# Patient Record
Sex: Female | Born: 1970 | Race: White | Hispanic: No | Marital: Married | Smoking: Former smoker
Health system: Southern US, Community
[De-identification: ages and names within clinical notes are randomized; demographics above are authoritative.]

## PROBLEM LIST (undated history)

## (undated) DIAGNOSIS — C801 Malignant (primary) neoplasm, unspecified: Secondary | ICD-10-CM

## (undated) DIAGNOSIS — C189 Malignant neoplasm of colon, unspecified: Secondary | ICD-10-CM

## (undated) HISTORY — PX: COLON RESECTION: SHX5231

## (undated) HISTORY — PX: TUBAL LIGATION: SHX77

## (undated) HISTORY — PX: VARICOSE VEIN SURGERY: SHX832

## (undated) HISTORY — PX: TONSILLECTOMY: SUR1361

## (undated) HISTORY — PX: CHOLECYSTECTOMY: SHX55

---

## 2011-10-10 ENCOUNTER — Emergency Department (HOSPITAL_COMMUNITY): Payer: Self-pay

## 2011-10-10 ENCOUNTER — Emergency Department (HOSPITAL_COMMUNITY)
Admission: EM | Admit: 2011-10-10 | Discharge: 2011-10-10 | Disposition: A | Discharge status: Home / Self Care | Payer: Self-pay | Attending: Emergency Medicine | Admitting: Emergency Medicine

## 2011-10-10 ENCOUNTER — Encounter (HOSPITAL_COMMUNITY): Payer: Self-pay | Admitting: *Deleted

## 2011-10-10 DIAGNOSIS — S9031XA Contusion of right foot, initial encounter: Secondary | ICD-10-CM

## 2011-10-10 DIAGNOSIS — M79609 Pain in unspecified limb: Secondary | ICD-10-CM | POA: Insufficient documentation

## 2011-10-10 DIAGNOSIS — W108XXA Fall (on) (from) other stairs and steps, initial encounter: Secondary | ICD-10-CM | POA: Insufficient documentation

## 2011-10-10 DIAGNOSIS — F17200 Nicotine dependence, unspecified, uncomplicated: Secondary | ICD-10-CM | POA: Insufficient documentation

## 2011-10-10 DIAGNOSIS — S9030XA Contusion of unspecified foot, initial encounter: Secondary | ICD-10-CM | POA: Insufficient documentation

## 2011-10-10 NOTE — ED Provider Notes (Signed)
Medical screening examination/treatment/procedure(s) were performed by non-physician practitioner and as supervising physician I was immediately available for consultation/collaboration.   Dare Spillman, MD 10/10/11 2041 

## 2011-10-10 NOTE — Discharge Instructions (Signed)
Crutch Use You have been prescribed crutches to take weight off one of your lower legs or feet (extremities). When using crutches, make sure you are not putting pressure on the armpit (axilla). This could cause damage to the nerves that extend from your axilla to the hand and arm. When fitted properly the crutches should be 2 to 3 finger widths below the axilla. Your weight should be supported by your hand, and not by resting upon the crutch with the axilla. When walking, first step with the crutches, then swing the healthy leg through and slightly ahead. When going up stairs, first step up with the healthy leg and then follow with the crutches and injured leg up to the same step, and so forth. If there is a handrail, hold both crutches in one hand, place your other hand on the handrail, and while placing your weight on your arms, lift your good leg to the step, then bring the crutches and the injured leg up to that step. Repeat for each step. When going down stairs, first step with the injured leg and crutches, following down with the healthy leg to the same step. Be very careful, as going down stairs with crutches is very challenging. If you feel wobbly or nervous, sit down and inch yourself down the stairs on your butt. To get up from a chair, hold injured leg forward, grab armrest with one hand and the top of the crutches with the other hand. Using these supports, pull yourself up to a standing position. Reverse this procedure for sitting. See your caregiver for follow up as suggested. If you are discharged in an ace wrap and develop numbness, tingling, swelling, or increased pain, loosen the ace wrap and re-wrap looser. If these problems persist, see your caregiver as needed. If you have been instructed to use partial weight bearing, bear (apply) the amount of weight as suggested by your caregiver. Do not bear weight in an amount that causes pain on the area of injury. Document Released: 04/26/2000  Document Revised: 04/18/2011 Document Reviewed: 07/04/2008 [facility] Patient Information 2012 [place], [place].Cryotherapy Cryotherapy means treatment with cold. Ice or gel packs can be used to reduce both pain and swelling. Ice is the most helpful within the first 24 to 48 hours after an injury or flareup from overusing a muscle or joint. Sprains, strains, spasms, burning pain, shooting pain, and aches can all be eased with ice. Ice can also be used when recovering from surgery. Ice is effective, has very few side effects, and is safe for most people to use. PRECAUTIONS  Ice is not a safe treatment option for people with:  Raynaud's phenomenon. This is a condition affecting small blood vessels in the extremities. Exposure to cold may cause your problems to return.   Cold hypersensitivity. There are many forms of cold hypersensitivity, including:   Cold urticaria. Red, itchy hives appear on the skin when the tissues begin to warm after being iced.   Cold erythema. This is a red, itchy rash caused by exposure to cold.   Cold hemoglobinuria. Red blood cells break down when the tissues begin to warm after being iced. The hemoglobin that carry oxygen are passed into the urine because they cannot combine with blood proteins fast enough.   Numbness or altered sensitivity in the area being iced.  If you have any of the following conditions, do not use ice until you have discussed cryotherapy with your caregiver:  Heart conditions, such as arrhythmia, angina, or chronic  heart disease.   High blood pressure.   Healing wounds or open skin in the area being iced.   Current infections.   Rheumatoid arthritis.   Poor circulation.   Diabetes.  Ice slows the blood flow in the region it is applied. This is beneficial when trying to stop inflamed tissues from spreading irritating chemicals to surrounding tissues. However, if you expose your skin to cold temperatures for too long or without the proper  protection, you can damage your skin or nerves. Watch for signs of skin damage due to cold. HOME CARE INSTRUCTIONS Follow these tips to use ice and cold packs safely.  Place a dry or damp towel between the ice and skin. A damp towel will cool the skin more quickly, so you may need to shorten the time that the ice is used.   For a more rapid response, add gentle compression to the ice.   Ice for no more than 10 to 20 minutes at a time. The bonier the area you are icing, the less time it will take to get the benefits of ice.   Check your skin after 5 minutes to make sure there are no signs of a poor response to cold or skin damage.   Rest 20 minutes or more in between uses.   Once your skin is numb, you can end your treatment. You can test numbness by very lightly touching your skin. The touch should be so light that you do not see the skin dimple from the pressure of your fingertip. When using ice, most people will feel these normal sensations in this order: cold, burning, aching, and numbness.   Do not use ice on someone who cannot communicate their responses to pain, such as small children or people with dementia.  HOW TO MAKE AN ICE PACK Ice packs are the most common way to use ice therapy. Other methods include ice massage, ice baths, and cryo-sprays. Muscle creams that cause a cold, tingly feeling do not offer the same benefits that ice offers and should not be used as a substitute unless recommended by your caregiver. To make an ice pack, do one of the following:  Place crushed ice or a bag of frozen vegetables in a sealable plastic bag. Squeeze out the excess air. Place this bag inside another plastic bag. Slide the bag into a pillowcase or place a damp towel between your skin and the bag.   Mix 3 parts water with 1 part rubbing alcohol. Freeze the mixture in a sealable plastic bag. When you remove the mixture from the freezer, it will be slushy. Squeeze out the excess air. Place this  bag inside another plastic bag. Slide the bag into a pillowcase or place a damp towel between your skin and the bag.  SEEK MEDICAL CARE IF:  You develop white spots on your skin. This may give the skin a blotchy (mottled) appearance.   Your skin turns blue or pale.   Your skin becomes waxy or hard.   Your swelling gets worse.  MAKE SURE YOU:   Understand these instructions.   Will watch your condition.   Will get help right away if you are not doing well or get worse.  Document Released: 12/24/2010 Document Revised: 04/18/2011 Document Reviewed: 12/24/2010 [facility] Patient Information 2012 [place], [place].Contusion      A contusion is a deep bruise. Contusions are the result of an injury that caused bleeding under the skin. The contusion may turn blue, purple, or  yellow. Minor injuries will give you a painless contusion, but more severe                                                                                                                                            contusions may stay painful and swollen for a few weeks.  CAUSES  A contusion is usually caused by a blow, trauma, or direct force to an area of the body. SYMPTOMS   Swelling and redness of the injured area.   Bruising of the injured area.   Tenderness and soreness of the injured area.   Pain.  DIAGNOSIS  The diagnosis can be made by taking a history and physical exam. An X-ray, CT scan, or MRI may be needed to determine if there were any associated injuries, such as fractures. TREATMENT  Specific treatment will depend on what area of the body was injured. In general, the best treatment for a contusion is resting, icing, elevating, and applying cold compresses to the injured area. Over-the-counter medicines may also be recommended for pain control. Ask your caregiver what  the best treatment is for your contusion. HOME CARE INSTRUCTIONS   Put ice on the injured area.   Put ice in a plastic bag.   Place a towel between your skin and the bag.   Leave the ice on for 15 to 20 minutes, 3 to 4 times a day.   Only take over-the-counter or prescription medicines for pain, discomfort, or fever as directed by your caregiver. Your caregiver may recommend avoiding anti-inflammatory medicines (aspirin, ibuprofen, and naproxen) for 48 hours because these medicines may increase bruising.   Rest the injured area.   If possible, elevate the injured area to reduce swelling.  SEEK IMMEDIATE MEDICAL CARE IF:   You have increased bruising or swelling.   You have pain that is getting worse.   Your swelling or pain is not relieved with medicines.  MAKE SURE YOU:   Understand these instructions.   Will watch your condition.   Will get help right away if you are not doing well or get worse.  Document Released: 02/06/2005 Document Revised: 04/18/2011 Document Reviewed: 03/04/2011 [facility] Patient Information 2012 [place], [place].   Apply ice 20-30  Min several times daily and elevate foot as much as possible.  Do not attempt bearing   weight until removing the watson Chavarin dressing in 5 days.  After that use the crutches as needed and bear weight as tolerated.  Take ibuprofen 800 mg every 8  hrs with food.  Follow up with dr. Hilda Lias as needed.

## 2011-10-10 NOTE — ED Provider Notes (Signed)
History     CSN: [id number]  Arrival date & time 10/10/11  1543   First MD Initiated Contact with Patient 10/10/11 1617      Chief Complaint  Patient presents with  . Fall    (Consider location/radiation/quality/duration/timing/severity/associated sxs/prior treatment) HPI Comments: Although pt slid on lower back and buttocks she denies any pain.  no head or neck trauma.  No LOC.    Patient is a 41 y.o. female presenting with fall. The history is provided by the patient. No language interpreter was used.  Fall Incident onset: this AM. Incident: while descending stairs.  had one arm full of laundry and the other  hand holding dishes.  slippery shoes caused her to fall  and slide down 13 stairs with the R foot under her body.   Pain location: R foot. The pain is moderate. She was ambulatory at the scene. There was no entrapment after the fall. There was no drug use involved in the accident. There was no alcohol use involved in the accident. Pertinent negatives include no numbness and no loss of consciousness. The symptoms are aggravated by ambulation and pressure on the injury. She has tried NSAIDs for the symptoms. The treatment provided mild relief.    History reviewed. No pertinent past medical history.  Past Surgical History  Procedure Date  . Tubal ligation   . Tonsillectomy   . Varicose vein surgery     History reviewed. No pertinent family history.  History  Substance Use Topics  . Smoking status: Current Everyday Smoker  . Smokeless tobacco: Not on file  . Alcohol Use: Yes     occasionally    OB History    Grav Para Term Preterm Abortions TAB SAB Ect Mult Living                  Review of Systems  Musculoskeletal: Positive for gait problem.       Foot injury  Neurological: Negative for loss of consciousness and numbness.  Psychiatric/Behavioral: Negative for confusion.  All other systems reviewed and are negative.    Allergies  Review of patient's  allergies indicates no known allergies.  Home Medications  No current outpatient prescriptions on file.  BP 135/91  Pulse 93  Temp(Src) 98 F (36.7 C) (Oral)  Resp 16  Ht 5\' 8"  (1.727 m)  Wt 172 lb (78.019 kg)  BMI 26.15 kg/m2  SpO2 100%  LMP 09/22/2011  Physical Exam  Nursing note and vitals reviewed. Constitutional: She is oriented to person, place, and time. She appears well-developed and well-nourished. No distress.  HENT:  Head: Normocephalic and atraumatic.  Eyes: EOM are normal.  Neck: Normal range of motion.  Cardiovascular: Normal rate, regular rhythm and normal heart sounds.   Pulmonary/Chest: Effort normal and breath sounds normal.  Abdominal: Soft. She exhibits no distension. There is no tenderness.  Musculoskeletal: She exhibits tenderness.       Right foot: She exhibits decreased range of motion, tenderness, bony tenderness and swelling. She exhibits normal capillary refill, no crepitus, no deformity and no laceration.       Feet:  Neurological: She is alert and oriented to person, place, and time.  Skin: Skin is warm and dry.  Psychiatric: She has a normal mood and affect. Judgment normal.    ED Course  Procedures (including critical care time)  Labs Reviewed - No data to display Dg Foot Complete Right  10/10/2011  *RADIOLOGY REPORT*  Clinical Data: Right foot pain following a  fall.  RIGHT FOOT COMPLETE - 3+ VIEW  Comparison: None.  Findings: Three views of the right foot demonstrate mild dorsal soft tissue swelling distally.  No fracture or dislocation seen. Calcaneal spurs.  IMPRESSION: No fracture.  Original Report Authenticated By: Darrol Angel, M.D.     1. Foot contusion, right, initial encounter       MDM  No fx identified.  Ice, elevation, watson Richeson dressing x 5 days then weight bearing as tolerated. Ibuprofen 800 mg TID with food.  F/u with dr. Hilda Lias prn.        Worthy Rancher, PA 10/10/11 1729

## 2011-10-10 NOTE — ED Notes (Signed)
Pt c/o falling down 13 steps this am. Pt c/o pain in her right foot. States that she landed on her back and her right foot curled under. Pt denies back pain.

## 2011-12-02 ENCOUNTER — Emergency Department (HOSPITAL_COMMUNITY)
Admission: EM | Admit: 2011-12-02 | Discharge: 2011-12-02 | Disposition: A | Discharge status: Home / Self Care | Payer: Self-pay | Attending: Emergency Medicine | Admitting: Emergency Medicine

## 2011-12-02 ENCOUNTER — Encounter (HOSPITAL_COMMUNITY): Payer: Self-pay | Admitting: Emergency Medicine

## 2011-12-02 DIAGNOSIS — M25519 Pain in unspecified shoulder: Secondary | ICD-10-CM | POA: Insufficient documentation

## 2011-12-02 DIAGNOSIS — W57XXXA Bitten or stung by nonvenomous insect and other nonvenomous arthropods, initial encounter: Secondary | ICD-10-CM | POA: Insufficient documentation

## 2011-12-02 DIAGNOSIS — F17200 Nicotine dependence, unspecified, uncomplicated: Secondary | ICD-10-CM | POA: Insufficient documentation

## 2011-12-02 DIAGNOSIS — S30860A Insect bite (nonvenomous) of lower back and pelvis, initial encounter: Secondary | ICD-10-CM | POA: Insufficient documentation

## 2011-12-02 MED ORDER — DOXYCYCLINE HYCLATE 100 MG PO TABS
100.0000 mg | ORAL_TABLET | Freq: Once | ORAL | Status: AC
  Administered 2011-12-02: 100 mg via ORAL
  Filled 2011-12-02: qty 1

## 2011-12-02 MED ORDER — DOXYCYCLINE HYCLATE 100 MG PO CAPS: 100.0000 mg | ORAL_CAPSULE | Freq: Two times a day (BID) | ORAL | Status: AC

## 2011-12-02 NOTE — ED Provider Notes (Signed)
Medical screening examination/treatment/procedure(s) were performed by non-physician practitioner and as supervising physician I was immediately available for consultation/collaboration.  Donnetta Hutching, MD 12/02/11 [phone]

## 2011-12-02 NOTE — ED Notes (Signed)
Pt c/o left shoulder pain x 1 week and "just not feeling well" since pulling off tick x 9 days ago.

## 2011-12-02 NOTE — ED Provider Notes (Signed)
History     CSN: [id number]  Arrival date & time 12/02/11  0901   First MD Initiated Contact with Patient 12/02/11 0920      Chief Complaint  Patient presents with  . Tick Removal    (Consider location/radiation/quality/duration/timing/severity/associated sxs/prior treatment) HPI Comments: Pt has removed 2 imbedded ticks from her in past 9 days.  C/o L shoulder soreness.  Spoke with family member who told her to come to the ED.  The history is provided by the patient. No language interpreter was used.    History reviewed. No pertinent past medical history.  Past Surgical History  Procedure Date  . Tubal ligation   . Tonsillectomy   . Varicose vein surgery     No family history on file.  History  Substance Use Topics  . Smoking status: Current Everyday Smoker  . Smokeless tobacco: Not on file  . Alcohol Use: Yes     occasionally    OB History    Grav Para Term Preterm Abortions TAB SAB Ect Mult Living                  Review of Systems  Constitutional: Negative for fever and chills.  Musculoskeletal: Positive for arthralgias.  Skin: Negative for rash.  All other systems reviewed and are negative.    Allergies  Review of patient's allergies indicates no known allergies.  Home Medications   Current Outpatient Rx  Name Route Sig Dispense Refill  . DOXYCYCLINE HYCLATE 100 MG PO CAPS Oral Take 1 capsule (100 mg total) by mouth 2 (two) times daily. 20 capsule 0    BP 141/91  Pulse 96  Temp 98 F (36.7 C)  Resp 18  Ht 5\' 8"  (1.727 m)  Wt 178 lb (80.74 kg)  BMI 27.06 kg/m2  SpO2 100%  LMP 11/25/2011  Physical Exam  Nursing note and vitals reviewed. Constitutional: She is oriented to person, place, and time. She appears well-developed and well-nourished. No distress.  HENT:  Head: Normocephalic and atraumatic.  Eyes: EOM are normal.  Neck: Normal range of motion.  Cardiovascular: Normal rate, regular rhythm and normal heart sounds.     Pulmonary/Chest: Effort normal and breath sounds normal.  Abdominal: Soft. She exhibits no distension. There is no tenderness.  Musculoskeletal: Normal range of motion.       Left shoulder: She exhibits normal range of motion, no tenderness, no bony tenderness and no swelling.       Arms:      Soreness with L shoulder movement.  Neurological: She is alert and oriented to person, place, and time.  Skin: Skin is warm and dry.  Psychiatric: She has a normal mood and affect. Judgment normal.    ED Course  Procedures (including critical care time)  Labs Reviewed - No data to display No results found.   1. Tick bite       MDM  rx-doxycycline, 20         Evalina Field, [place] 12/02/11 1021  Evalina Field, [place] 12/02/11 1023

## 2013-03-30 ENCOUNTER — Other Ambulatory Visit (HOSPITAL_COMMUNITY): Payer: Self-pay | Admitting: Physician Assistant

## 2013-03-30 DIAGNOSIS — Z139 Encounter for screening, unspecified: Secondary | ICD-10-CM

## 2013-04-15 ENCOUNTER — Ambulatory Visit (HOSPITAL_COMMUNITY): Payer: Self-pay

## 2013-05-10 ENCOUNTER — Ambulatory Visit (HOSPITAL_COMMUNITY)
Admission: RE | Admit: 2013-05-10 | Discharge: 2013-05-10 | Disposition: A | Discharge status: Home / Self Care | Payer: Self-pay | Source: Ambulatory Visit | Attending: Physician Assistant | Admitting: Physician Assistant

## 2013-05-10 DIAGNOSIS — Z139 Encounter for screening, unspecified: Secondary | ICD-10-CM

## 2018-10-18 ENCOUNTER — Emergency Department (HOSPITAL_COMMUNITY): Payer: BC Managed Care – PPO

## 2018-10-18 ENCOUNTER — Emergency Department (HOSPITAL_COMMUNITY)
Admission: EM | Admit: 2018-10-18 | Discharge: 2018-10-18 | Disposition: A | Discharge status: Home / Self Care | Payer: BC Managed Care – PPO | Attending: Emergency Medicine | Admitting: Emergency Medicine

## 2018-10-18 ENCOUNTER — Encounter (HOSPITAL_COMMUNITY): Payer: Self-pay | Admitting: *Deleted

## 2018-10-18 ENCOUNTER — Other Ambulatory Visit: Payer: Self-pay

## 2018-10-18 DIAGNOSIS — M25571 Pain in right ankle and joints of right foot: Secondary | ICD-10-CM | POA: Insufficient documentation

## 2018-10-18 DIAGNOSIS — Z87891 Personal history of nicotine dependence: Secondary | ICD-10-CM | POA: Insufficient documentation

## 2018-10-18 NOTE — ED Notes (Signed)
Pt reports she has been on prednisone for her L foot/ankle pain   Today stood up and R ankle felt like "soemthing snapped" with severe pain Here for eval   Pt has taken only prednisone for pain

## 2018-10-18 NOTE — Discharge Instructions (Addendum)
Continue taking ibuprofen for pain and swelling.  Add Tylenol on to help with pain. Use the brace to help support your ankle. Make sure you are wearing shoes with good support, this means thick soles with a good arch support. Try and rest your ankle when able. Call your primary care doctor to see if you can schedule a sooner appointment. Return to the emergency room with any new, worsening, concerning symptoms.

## 2018-10-18 NOTE — ED Notes (Signed)
From Rad 

## 2018-10-18 NOTE — ED Notes (Signed)
To Rad 

## 2018-10-18 NOTE — ED Triage Notes (Signed)
Pt c/o right ankle pain that started today when she tried to stand up. Pt reports her ankle gave away and felt like it was "going to snap". Denies injury.

## 2018-10-18 NOTE — ED Provider Notes (Signed)
[facility] EMERGENCY DEPARTMENT Provider Note   CSN: [id number] Arrival date & time: 10/18/18  1446    History   Chief Complaint Chief Complaint  Patient presents with   Ankle Pain    HPI Amy BeltsRobin Gorter is a 48 y.o. female presenting for evaluation of right ankle pain.  Patient states she has been having gradually worsening right ankle pain.  She states it feels like her ankle is going to give out on her.  It worsened significantly last night.  Patient states she has been having pain and swelling of her left ankle, and as such has been favoring her right side.  Patient reports pain starts at bilateral malleoli and radiates up.  No pain at rest, pain with weightbearing and certain positions.  She denies numbness or tingling.  She denies fall, trauma, or injury.  She has been taking 600 mg ibuprofen q4-5 hrs without improvement of sxs.  She denies pain in her calves.  She has no other medical problems, takes no medications daily.  She did recently finish a course of prednisone for her left foot without improvement.  She is not on any antibiotics currently.     HPI  History reviewed. No pertinent past medical history.  There are no active problems to display for this patient.   Past Surgical History:  Procedure Laterality Date   TONSILLECTOMY     TUBAL LIGATION     VARICOSE VEIN SURGERY       OB History   No obstetric history on file.      Home Medications    Prior to Admission medications   Not on File    Family History No family history on file.  Social History Social History   Tobacco Use   Smoking status: Former Smoker   Smokeless tobacco: Never Used  Substance Use Topics   Alcohol use: Yes    Comment: occasionally   Drug use: No     Allergies   Patient has no known allergies.   Review of Systems Review of Systems  Musculoskeletal: Positive for arthralgias and myalgias.  Hematological: Does not bruise/bleed easily.     Physical  Exam Updated Vital Signs BP 134/83 (BP Location: Right Arm)    Pulse 84    Temp 98.5 F (36.9 C) (Oral)    Resp 20    Ht 5\' 8"  (1.727 m)    Wt 78 kg    SpO2 100%    BMI 26.15 kg/m   Physical Exam Vitals signs and nursing note reviewed.  Constitutional:      General: She is not in acute distress.    Appearance: She is well-developed.  HENT:     Head: Normocephalic and atraumatic.  Neck:     Musculoskeletal: Normal range of motion.  Pulmonary:     Effort: Pulmonary effort is normal.  Abdominal:     General: There is no distension.  Musculoskeletal:        General: Tenderness present. No swelling or deformity.     Comments: Tenderness palpation at rest.  Increased pain with hyper dorsiflexion of the ankle.  Pain with inversion and eversion of the ankle.  No pain with plantarflexion.  Pedal pulses intact bilaterally.  Good cap refill distally.  No tenderness palpation of the calf.  No significant swelling or erythema.  Achilles tendon palpable intact without tenderness.  Skin:    General: Skin is warm.     Capillary Refill: Capillary refill takes less than 2 seconds.  Findings: No rash.  Neurological:     Mental Status: She is alert and oriented to person, place, and time.      ED Treatments / Results  Labs (all labs ordered are listed, but only abnormal results are displayed) Labs Reviewed - No data to display  EKG None  Radiology Dg Ankle Complete Right  Result Date: 10/18/2018 CLINICAL DATA:  Severe right ankle pain after feeling like something snapped when she stood up today. EXAM: RIGHT ANKLE - COMPLETE 3+ VIEW COMPARISON:  Right foot x-rays dated Oct 10, 2011. FINDINGS: No acute fracture or dislocation. The ankle mortise is symmetric. The talar dome is intact. No tibiotalar joint effusion. Joint spaces are preserved. Bone mineralization is normal. Small Achilles and plantar enthesophytes. Soft tissues are unremarkable. IMPRESSION: Negative. Electronically Signed   By:  Titus Dubin M.D.   On: 10/18/2018 15:39    Procedures Procedures (including critical care time)  Medications Ordered in ED Medications - No data to display   Initial Impression / Assessment and Plan / ED Course  I have reviewed the triage vital signs and the nursing notes.  Pertinent labs & imaging results that were available during my care of the patient were reviewed by me and considered in my medical decision making (see chart for details).        Pt presenting for evaluation of right ankle pain.  Physical exam reassuring, she is neurovascularly intact.  X-rays viewed and interpreted by me, no fracture or dislocation.  Pain is been worsening after patient has been favoring her left ankle, this putting more strain on her right side.  Pain is worse with certain position and weightbearing, as such likely muscular/tendon/ligament.  Discussed symptomatic treatment with support such as an ASO, importance of supporting the sole of her foot, continue treatment with Tylenol, ibuprofen, and rest, ice, and elevation.  Encourage follow-up with her primary care doctor.  At this time, patient appears safe for discharge.  Return precautions given.  Patient states she understands and agrees to plan.   Final Clinical Impressions(s) / ED Diagnoses   Final diagnoses:  Acute right ankle pain    ED Discharge Orders    None       Franchot Heidelberg, PA-C 10/18/18 1712    Milton Ferguson, MD 10/18/18 2009

## 2019-02-23 ENCOUNTER — Other Ambulatory Visit: Payer: Self-pay

## 2019-02-23 DIAGNOSIS — Z20822 Contact with and (suspected) exposure to covid-19: Secondary | ICD-10-CM

## 2019-02-25 LAB — NOVEL CORONAVIRUS, NAA: SARS-CoV-2, NAA: NOT DETECTED

## 2019-02-26 ENCOUNTER — Telehealth: Payer: Self-pay | Admitting: Family Medicine

## 2019-02-26 NOTE — Telephone Encounter (Signed)
Negative COVID results given. Patient results "NOT Detected." Caller expressed understanding. ° °

## 2019-08-15 ENCOUNTER — Other Ambulatory Visit: Payer: Self-pay

## 2019-08-15 ENCOUNTER — Ambulatory Visit
Admission: EM | Admit: 2019-08-15 | Discharge: 2019-08-15 | Disposition: A | Discharge status: Home / Self Care | Payer: BC Managed Care – PPO | Attending: Emergency Medicine | Admitting: Emergency Medicine

## 2019-08-15 DIAGNOSIS — W57XXXA Bitten or stung by nonvenomous insect and other nonvenomous arthropods, initial encounter: Secondary | ICD-10-CM

## 2019-08-15 DIAGNOSIS — S40261A Insect bite (nonvenomous) of right shoulder, initial encounter: Secondary | ICD-10-CM

## 2019-08-15 MED ORDER — DOXYCYCLINE HYCLATE 100 MG PO TABS
200.0000 mg | ORAL_TABLET | Freq: Once | ORAL | Status: AC
  Administered 2019-08-15: 10:00:00 200 mg via ORAL

## 2019-08-15 NOTE — ED Triage Notes (Signed)
Pt states that she removed tick on right shoulder on Friday , states that right arm is now stiff

## 2019-08-15 NOTE — ED Provider Notes (Signed)
[place]   [id number] 08/15/19 Arrival Time: 0913  CC: Tick bite  SUBJECTIVE:  Amy Hardy is a 49 y.o. female complains of tick attached RT shoulder 2-3 days ago.  States she was outside doing yard work prior to tick bite.  Localizes the tick bite to RT shoulder.  Removed tick at home.  States tick was not engorged, but is unsure how long it was attached.  Reports previous tick bite in the past.  Reports associated RT elbow discomfort and mild nausea.  Denies fever, chills, vomiting, headache, dizziness, weakness, fatigue, rash, or abdominal pain.    ROS: As per HPI.  All other pertinent ROS negative.     History reviewed. No pertinent past medical history. Past Surgical History:  Procedure Laterality Date  . TONSILLECTOMY    . TUBAL LIGATION    . VARICOSE VEIN SURGERY     No Known Allergies No current facility-administered medications on file prior to encounter.   No current outpatient medications on file prior to encounter.   Social History   Socioeconomic History  . Marital status: Married    Spouse name: Not on file  . Number of children: Not on file  . Years of education: Not on file  . Highest education level: Not on file  Occupational History  . Not on file  Tobacco Use  . Smoking status: Former Research scientist (life sciences)  . Smokeless tobacco: Never Used  Substance and Sexual Activity  . Alcohol use: Yes    Comment: occasionally  . Drug use: No  . Sexual activity: Not on file  Other Topics Concern  . Not on file  Social History Narrative  . Not on file   Social Determinants of Health   Financial Resource Strain:   . Difficulty of Paying Living Expenses:   Food Insecurity:   . Worried About Charity fundraiser in the Last Year:   . Arboriculturist in the Last Year:   Transportation Needs:   . Film/video editor (Medical):   Marland Kitchen Lack of Transportation (Non-Medical):   Physical Activity:   . Days of Exercise per Week:   . Minutes of Exercise per Session:     Stress:   . Feeling of Stress :   Social Connections:   . Frequency of Communication with Friends and Family:   . Frequency of Social Gatherings with Friends and Family:   . Attends Religious Services:   . Active Member of Clubs or Organizations:   . Attends Archivist Meetings:   Marland Kitchen Marital Status:   Intimate Partner Violence:   . Fear of Current or Ex-Partner:   . Emotionally Abused:   Marland Kitchen Physically Abused:   . Sexually Abused:    History reviewed. No pertinent family history.  OBJECTIVE: Vitals:   08/15/19 0920  BP: (!) 145/82  Pulse: 95  Resp: 17  Temp: 97.9 F (36.6 C)  TempSrc: Oral  SpO2: 98%    General appearance: alert; no distress Head: NCAT Lungs: normal respiratory effort CV:  Radial pulse 2+ bilaterally Extremities: no edema Skin: warm and dry; small tattoo present; small punctate lesion to RT lateral deltoid with mild surrounding erythema with overlying scab formation, no active bleeding or discharge Psychological: alert and cooperative; normal mood and affect  ASSESSMENT & PLAN:  1. Tick bite of right shoulder, initial encounter     Meds ordered this encounter  Medications  . doxycycline (VIBRA-TABS) tablet 200 mg   Offered longer course of doxycycline.  Declines at this time.  Will follow up with PCP as needed for titers  Single prophylactic dose of doxycycline 200 mg given in office To prevent tick bites, wear long sleeves, long pants, and light colors. Use insect repellent. Follow the instructions on the bottle. If the tick is biting, do not try to remove it with heat, alcohol, petroleum jelly, or fingernail polish. Use tweezers, curved forceps, or a tick-removal tool to grasp the tick. Gently pull up until the tick lets go. Do not twist or jerk the tick. Do not squeeze or crush the tick. Return here or go to ER if you have any new or worsening symptoms (rash, nausea, vomiting, fever, chills, headache, fatigue)   Reviewed expectations  re: course of current medical issues. Questions answered. Outlined signs and symptoms indicating need for more acute intervention. Patient verbalized understanding. After Visit Summary given.   Alvino Chapel [place], PA-C 08/15/19 [phone]

## 2019-08-15 NOTE — Discharge Instructions (Signed)
Single prophylactic dose of doxycycline 200 mg given in office To prevent tick bites, wear long sleeves, long pants, and light colors. Use insect repellent. Follow the instructions on the bottle. If the tick is biting, do not try to remove it with heat, alcohol, petroleum jelly, or fingernail polish. Use tweezers, curved forceps, or a tick-removal tool to grasp the tick. Gently pull up until the tick lets go. Do not twist or jerk the tick. Do not squeeze or crush the tick. Return here or go to ER if you have any new or worsening symptoms (rash, nausea, vomiting, fever, chills, headache, fatigue)

## 2021-03-18 ENCOUNTER — Encounter (HOSPITAL_COMMUNITY): Payer: Self-pay | Admitting: *Deleted

## 2021-03-18 ENCOUNTER — Other Ambulatory Visit: Payer: Self-pay

## 2021-03-18 ENCOUNTER — Emergency Department (HOSPITAL_COMMUNITY): Payer: BC Managed Care – PPO

## 2021-03-18 ENCOUNTER — Emergency Department (HOSPITAL_COMMUNITY)
Admission: EM | Admit: 2021-03-18 | Discharge: 2021-03-18 | Disposition: A | Discharge status: Home / Self Care | Payer: BC Managed Care – PPO | Attending: Emergency Medicine | Admitting: Emergency Medicine

## 2021-03-18 DIAGNOSIS — R42 Dizziness and giddiness: Secondary | ICD-10-CM | POA: Diagnosis present

## 2021-03-18 DIAGNOSIS — F17210 Nicotine dependence, cigarettes, uncomplicated: Secondary | ICD-10-CM | POA: Insufficient documentation

## 2021-03-18 DIAGNOSIS — H81399 Other peripheral vertigo, unspecified ear: Secondary | ICD-10-CM | POA: Diagnosis not present

## 2021-03-18 LAB — CBC
HCT: 41.1 % (ref 36.0–46.0)
Hemoglobin: 13.5 g/dL (ref 12.0–15.0)
MCH: 30.8 pg (ref 26.0–34.0)
MCHC: 32.8 g/dL (ref 30.0–36.0)
MCV: 93.6 fL (ref 80.0–100.0)
Platelets: 224 10*3/uL (ref 150–400)
RBC: 4.39 MIL/uL (ref 3.87–5.11)
RDW: 12.5 % (ref 11.5–15.5)
WBC: 4.7 10*3/uL (ref 4.0–10.5)
nRBC: 0 % (ref 0.0–0.2)

## 2021-03-18 LAB — URINALYSIS, ROUTINE W REFLEX MICROSCOPIC
Bacteria, UA: NONE SEEN
Bilirubin Urine: NEGATIVE
Glucose, UA: NEGATIVE mg/dL
Ketones, ur: NEGATIVE mg/dL
Leukocytes,Ua: NEGATIVE
Nitrite: NEGATIVE
Protein, ur: NEGATIVE mg/dL
Specific Gravity, Urine: 1.004 — ABNORMAL LOW (ref 1.005–1.030)
pH: 8 (ref 5.0–8.0)

## 2021-03-18 LAB — CBG MONITORING, ED: Glucose-Capillary: 76 mg/dL (ref 70–99)

## 2021-03-18 LAB — BASIC METABOLIC PANEL
Anion gap: 6 (ref 5–15)
BUN: 8 mg/dL (ref 6–20)
CO2: 27 mmol/L (ref 22–32)
Calcium: 9.2 mg/dL (ref 8.9–10.3)
Chloride: 107 mmol/L (ref 98–111)
Creatinine, Ser: 0.69 mg/dL (ref 0.44–1.00)
GFR, Estimated: >60 mL/min (ref >60)
Glucose, Bld: 91 mg/dL (ref 70–99)
Potassium: 3.9 mmol/L (ref 3.5–5.1)
Sodium: 140 mmol/L (ref 135–145)

## 2021-03-18 MED ORDER — MECLIZINE HCL 12.5 MG PO TABS
25.0000 mg | ORAL_TABLET | Freq: Once | ORAL | Status: AC
  Administered 2021-03-18: 25 mg via ORAL
  Filled 2021-03-18: qty 2

## 2021-03-18 MED ORDER — MECLIZINE HCL 25 MG PO TABS: 25.0000 mg | ORAL_TABLET | Freq: Three times a day (TID) | ORAL | 0 refills | Status: DC | PRN

## 2021-03-18 NOTE — ED Notes (Signed)
Pt ambulated to the bathroom with a strong steady gait

## 2021-03-18 NOTE — Discharge Instructions (Addendum)
You have been evaluated for dizziness.  Please take meclizine as needed for dizziness.  You may follow-up closely with an ear nose and throat specialist if your symptoms persist.

## 2021-03-18 NOTE — ED Provider Notes (Signed)
[facility] EMERGENCY DEPARTMENT Provider Note   CSN: [id number] Arrival date & time: 03/18/21  0830     History Chief Complaint  Patient presents with   Dizziness    Amy Hardy is a 50 y.o. female.  The history is provided by the patient and medical records. No language interpreter was used.  Dizziness  50 year old female presenting complaining of dizziness.  Patient report for the past month she has had left ear discomfort.  She described more as a pressure sensation in her left ear with associated pain.  She initially was seen by her doctor for her ear discomfort and was diagnosed with an ear infection and was started on antibiotic and steroid.  Despite taking the medication she did not report any improvement.  She follow-up approximately 2 weeks later, at that time it was noted that she did not have any signs of infection and was given Flonase.  She tried medication without relief.  She was seen in the third several days later but was not diagnosed with ear infection.  She continues to endorse pressure in her left ear and yesterday she developed an acute onset of dizziness.  She described dizziness as "a drunken sensation" and felt very unsteady, follows with bouts of nausea, copious amount of vomiting with mild headache.  No thunderclap headache.  Symptom did subside but this morning when she got up, symptoms resumed prompting this ER visit.  She did report having COVID during the past month.  States that has since resolved.  She denies fever, diplopia, tinnitus, sneezing coughing, runny nose or neck stiffness.  No injury.  Rates her symptoms as moderate in severity.  History reviewed. No pertinent past medical history.  There are no problems to display for this patient.   Past Surgical History:  Procedure Laterality Date   CHOLECYSTECTOMY     TONSILLECTOMY     TUBAL LIGATION     VARICOSE VEIN SURGERY       OB History   No obstetric history on file.     No family history  on file.  Social History   Tobacco Use   Smoking status: Some Days    Types: Cigarettes   Smokeless tobacco: Never   Tobacco comments:    1-2 cigarettes "once in a blue moon" as of 03/18/21       Vaping Use   Vaping Use: Never used  Substance Use Topics   Alcohol use: Not Currently    Comment: occasionally   Drug use: No    Home Medications Prior to Admission medications   Not on File    Allergies    Patient has no known allergies.  Review of Systems   Review of Systems  Neurological:  Positive for dizziness.  All other systems reviewed and are negative.  Physical Exam Updated Vital Signs BP (!) 155/89 (BP Location: Left Arm)   Pulse 92   Temp 98.2 F (36.8 C) (Oral)   Resp 14   Ht 5\' 8"  (1.727 m)   Wt 67.6 kg   SpO2 100%   BMI 22.66 kg/m   Physical Exam Vitals and nursing note reviewed.  Constitutional:      General: She is not in acute distress.    Appearance: She is well-developed.  HENT:     Head: Atraumatic.     Right Ear: Tympanic membrane, ear canal and external ear normal. There is no impacted cerumen.     Left Ear: Tympanic membrane, ear canal and external ear  normal. There is no impacted cerumen.     Nose: Nose normal.     Mouth/Throat:     Mouth: Mucous membranes are moist.  Eyes:     Extraocular Movements: Extraocular movements intact.     Conjunctiva/sclera: Conjunctivae normal.     Pupils: Pupils are equal, round, and reactive to light.     Comments: Fatigable horizontal nystagmus noted  Cardiovascular:     Rate and Rhythm: Normal rate and regular rhythm.     Pulses: Normal pulses.  Pulmonary:     Effort: Pulmonary effort is normal.  Abdominal:     Palpations: Abdomen is soft.     Tenderness: There is no abdominal tenderness.  Musculoskeletal:     Cervical back: Normal range of motion and neck supple.  Skin:    Findings: No rash.  Neurological:     Mental Status: She is alert and oriented to person, place, and time.      Comments: Neurologic exam:  Speech clear, pupils equal round reactive to light, extraocular movements intact  Normal peripheral visual fields Cranial nerves III through XII normal including no facial droop Follows commands, moves all extremities x4, normal strength to bilateral upper and lower extremities at all major muscle groups including grip Sensation normal to light touch  Coordination intact, no limb ataxia, finger-nose-finger normal Rapid alternating movements normal No pronator drift Gait normal   Psychiatric:        Mood and Affect: Mood normal.    ED Results / Procedures / Treatments   Labs (all labs ordered are listed, but only abnormal results are displayed) Labs Reviewed  URINALYSIS, ROUTINE W REFLEX MICROSCOPIC - Abnormal; Notable for the following components:      Result Value   Color, Urine STRAW (*)    Specific Gravity, Urine 1.004 (*)    Hgb urine dipstick SMALL (*)    All other components within normal limits  BASIC METABOLIC PANEL  CBC  CBG MONITORING, ED    EKG None  Date: 03/18/2021  Rate: 79  Rhythm: normal sinus rhythm  QRS Axis: normal  Intervals: normal  ST/T Wave abnormalities: normal  Conduction Disutrbances: none  Narrative Interpretation:   Old EKG Reviewed: No significant changes noted    Radiology CT HEAD WO CONTRAST  Result Date: 03/18/2021 CLINICAL DATA:  Dizziness, headache EXAM: CT HEAD WITHOUT CONTRAST TECHNIQUE: Contiguous axial images were obtained from the base of the skull through the vertex without intravenous contrast. COMPARISON:  None. FINDINGS: Brain: No evidence of acute infarction, hemorrhage, hydrocephalus, extra-axial collection or mass lesion/mass effect. Vascular: Negative for hyperdense vessel Skull: Negative Sinuses/Orbits: Negative Other: None IMPRESSION: Negative CT head Electronically Signed   By: Marlan Palau M.D.   On: 03/18/2021 10:56    Procedures Procedures   Medications Ordered in ED Medications   meclizine (ANTIVERT) tablet 25 mg (25 mg Oral Given 03/18/21 1024)    ED Course  I have reviewed the triage vital signs and the nursing notes.  Pertinent labs & imaging results that were available during my care of the patient were reviewed by me and considered in my medical decision making (see chart for details).    MDM Rules/Calculators/A&P                           BP 126/82   Pulse 77   Temp 98.2 F (36.8 C) (Oral)   Resp (!) 9   Ht 5\' 8"  (1.727 m)  Wt 67.6 kg   SpO2 99%   BMI 22.66 kg/m   Final Clinical Impression(s) / ED Diagnoses Final diagnoses:  Peripheral vertigo, unspecified laterality    Rx / DC Orders ED Discharge Orders          Ordered    meclizine (ANTIVERT) 25 MG tablet  3 times daily PRN        03/18/21 1223           10:05 AM Patient presents with acute onset of dizziness after having left ear discomfort for the past month and has been seen evaluate multiple times for it.  Ear exam unremarkable no evidence of ear infection.  No focal neurodeficit on exam, normal gait.  She does endorse some mild headache but no thunderclap headache suggestive of subarachnoid hemorrhage.  Will obtain head CT scan.  I did reach out to neuroradiologist, Dr. Margo Aye for recommendations of imaging, and he felt head CT scan should be able to assess for potential middle ear infection however things like labyrinthitis or schwannoma will be best evaluated through brain MRI with and without contrast as it is the gold standard.  I felt this can be done outpatient if her CT scan unremarkable.  Care discussed with Dr. Estell Harpin.   12:22 PM Labs are reassuring, head CT scan unremarkable, after receiving meclizine patient felt better.  I do encourage patient to follow-up with an ear nose and throat specialist if her symptoms persist she may benefit from an MRI if indicated.  Otherwise patient discharged home with meclizine.  EKG and her labs are reassuring.  Suspect peripheral vertigo,  doubt central cause.   Fayrene Helper, PA-C 03/18/21 1224    Bethann Berkshire, MD 03/20/21 1705

## 2021-03-18 NOTE — ED Triage Notes (Signed)
Pt c/o sudden onset of dizziness that started last night. Pt reports she feels like she is "going to fall over". Pt has had left ear pain for awhile now and initially was told it was an infection and given antibiotics. Pt's ear continued to hurt but has seen the doctor twice and told nothing was wrong. Pt says she still feels pressure in the left ear and wonders if this is related to the dizziness.

## 2022-01-19 ENCOUNTER — Ambulatory Visit
Admission: EM | Admit: 2022-01-19 | Discharge: 2022-01-19 | Disposition: A | Discharge status: Home / Self Care | Payer: BC Managed Care – PPO | Attending: Family Medicine | Admitting: Family Medicine

## 2022-01-19 DIAGNOSIS — R42 Dizziness and giddiness: Secondary | ICD-10-CM | POA: Diagnosis not present

## 2022-01-19 DIAGNOSIS — H9202 Otalgia, left ear: Secondary | ICD-10-CM

## 2022-01-19 MED ORDER — PREDNISONE 10 MG (48) PO TBPK: ORAL_TABLET | ORAL | 0 refills | Status: AC

## 2022-01-19 MED ORDER — ONDANSETRON 4 MG PO TBDP: 4.0000 mg | ORAL_TABLET | Freq: Three times a day (TID) | ORAL | 0 refills | Status: AC | PRN

## 2022-01-19 NOTE — ED Triage Notes (Signed)
Pt reports left ear pain, ear fullness and felt a pop sound since last night. Pt reports she is being having trouble with her ears since she had COVID in October 2022 and she is doing follow ups with ENT and PCP.

## 2022-01-21 NOTE — ED Provider Notes (Signed)
  [facility]   [id number] 01/19/22 Arrival Time: 4098  ASSESSMENT & PLAN:  1. Otalgia of left ear   2. Vertigo    Meds ordered this encounter  Medications   predniSONE (STERAPRED UNI-PAK 48 TAB) 10 MG (48) TBPK tablet    Sig: Take as directed.    Dispense:  48 tablet    Refill:  0   ondansetron (ZOFRAN-ODT) 4 MG disintegrating tablet    Sig: Take 1 tablet (4 mg total) by mouth every 8 (eight) hours as needed for nausea or vomiting.    Dispense:  30 tablet    Refill:  0   Cough medication sedation precautions. Discussed typical duration of symptoms. OTC symptom care as needed. Ensure adequate fluid intake and rest. May f/u with PCP or here as needed.  Reviewed expectations re: course of current medical issues. Questions answered. Outlined signs and symptoms indicating need for more acute intervention. Patient verbalized understanding. After Visit Summary given.   SUBJECTIVE: History from: patient.  Amy Hardy is a 51 y.o. female who reports left ear pain, ear fullness and felt a pop sound since last night. Pt reports she is being having trouble with her ears since she had COVID in October 2022 and she is doing follow ups with ENT and PCP. Reports steroids have helped in the past. Otherwise well. No recent illnesses. Afebrile. Vertigo with quick head movements. No change from previous episodes.   Social History   Tobacco Use  Smoking Status Some Days   Types: Cigarettes  Smokeless Tobacco Never  Tobacco Comments   1-2 cigarettes "once in a blue moon" as of 03/18/21        OBJECTIVE:  Vitals:   01/19/22 1007  BP: 127/82  Pulse: 86  Resp: 16  Temp: 98.1 F (36.7 C)  TempSrc: Oral  SpO2: 99%    General appearance: alert; appears fatigued Ear Canal: normal bilat TM: bilateral: appear normal; slight clear fluid behind TMs Neck: supple without LAD Lungs: unlabored respirations, symmetrical air entry; cough: absent; no respiratory distress Skin: warm  and dry Psychological: alert and cooperative; normal mood and affect  No Known Allergies  History reviewed. No pertinent past medical history. History reviewed. No pertinent family history. Social History   Socioeconomic History   Marital status: Married    Spouse name: Not on file   Number of children: Not on file   Years of education: Not on file   Highest education level: Not on file  Occupational History   Not on file  Tobacco Use   Smoking status: Some Days    Types: Cigarettes   Smokeless tobacco: Never   Tobacco comments:    1-2 cigarettes "once in a blue moon" as of 03/18/21       Vaping Use   Vaping Use: Never used  Substance and Sexual Activity   Alcohol use: Not Currently    Comment: occasionally   Drug use: No   Sexual activity: Yes  Other Topics Concern   Not on file  Social History Narrative   Not on file   Social Determinants of Health   Financial Resource Strain: Not on file  Food Insecurity: Not on file  Transportation Needs: Not on file  Physical Activity: Not on file  Stress: Not on file  Social Connections: Not on file  Intimate Partner Violence: Not on file             Mardella Layman, MD 01/21/22 1250

## 2022-03-26 ENCOUNTER — Ambulatory Visit (HOSPITAL_COMMUNITY): Payer: BC Managed Care – PPO | Attending: Otolaryngology | Admitting: Physical Therapy

## 2022-03-26 DIAGNOSIS — R42 Dizziness and giddiness: Secondary | ICD-10-CM | POA: Diagnosis present

## 2022-03-26 NOTE — Therapy (Signed)
OUTPATIENT PHYSICAL THERAPY VESTIBULAR EVALUATION     Patient Name: Amy Hardy MRN: [id number] DOB:10-05-1970, 51 y.o., female Today's Date: 03/26/2022   PT End of Session - 03/26/22 1632     Visit Number 1    Number of Visits 8    Date for PT Re-Evaluation 05/21/22    Authorization Type bcbs    Progress Note Due on Visit 8    PT Start Time 1600    PT Stop Time 1630    PT Time Calculation (min) 30 min    Activity Tolerance Patient tolerated treatment well    Behavior During Therapy [facility] for tasks assessed/performed             No past medical history on file. Past Surgical History:  Procedure Laterality Date   CHOLECYSTECTOMY     TONSILLECTOMY     TUBAL LIGATION     VARICOSE VEIN SURGERY     There are no problems to display for this patient.   PCP: Marcene Brawn REFERRING PROVIDER: Leta Baptist  REFERRING DIAG:  Diagnosis Description  PT eval/tx for recurrent dizziness, left ear pain, left ear pressure    THERAPY DIAG:  Dizziness  ONSET DATE: 2020  Rationale for Evaluation and Treatment: Rehabilitation  SUBJECTIVE:   SUBJECTIVE STATEMENT: PT states that she becomes dizzy many times throughout the day.  Her left ear feels full she went to see Dr. Benjamine Mola and he stated that she has central vertigo.  She gets so dizzy that she feels like she is going to vomit.  When she looks at people it looks like they are oscillating, when she does the dishes and they clink the noise bothers her .  She has been trying to push through it but it is getting harder and harder.   Pt accompanied by: self  PERTINENT HISTORY: Amy Hardy is a 51 y.o. female who reports left ear pain, ear fullness and felt a pop sound with occasional dizziness with quick head turns. Pt reports she is being having trouble with her ears since she had COVID in October 2022   PAIN:  Are you having pain? No  PRECAUTIONS: None  WEIGHT BEARING RESTRICTIONS: No  FALLS: Has patient fallen in last 6  months? No  LIVING ENVIRONMENT: Lives with: lives with their family Lives in: House/apartment Stairs: Yes: Internal: 13 steps; on right going up pt states that she has a death grip on the rail. Has following equipment at home: None  PLOF: Independent  PATIENT GOALS: less dizziness   OBJECTIVE:   DIAGNOSTIC FINDINGS: CT of brainElectronically Signed   By: Franchot Gallo M.D.   On: 03/18/2021 10:56 Overall cognitive status: Within functional limits for tasks assessed    COGNITION:unremarkable       POSTURE:  No Significant postural limitations  Cervical ROM:  wfl  VESTIBULAR ASSESSMENT:  GENERAL OBSERVATION: unremarkable    SYMPTOM BEHAVIOR:  Subjective history: see above  Non-Vestibular symptoms:  fullness in ears  , things look like they are vibrating   Type of dizziness: Spinning/Vertigo and "World moves"  Frequency: off and on all day long but she does not get dizzy driving.   Duration: a few minutes   Aggravating factors:  mornings   Relieving factors: rest  Progression of symptoms: unchanged  OCULOMOTOR EXAM:  Ocular Alignment: normal  Ocular ROM: No Limitations  Spontaneous Nystagmus: absent  Gaze-Induced Nystagmus: absent things become blurry   Smooth Pursuits: intact but makes pt feel nauseated.   Saccades: intact  VESTIBULAR - OCULAR REFLEX:   Slow VOR: Normal  VOR Cancellation: Comment: become nauseated     POSITIONAL TESTING: Right Dix-Hallpike: no nystagmus Left Dix-Hallpike: no nystagmus Right Roll Test: no nystagmus Left Roll Test: no nystagmus  MOTION SENSITIVITY:  Motion Sensitivity Quotient Intensity: 0 = none, 1 = Lightheaded, 2 = Mild, 3 = Moderate, 4 = Severe, 5 = Vomiting  Intensity  1. Sitting to supine 1  2. Supine to L side   3. Supine to R side   4. Supine to sitting 3  5. L Hallpike-Dix 0  6. Up from L  0  7. R Hallpike-Dix 0  8. Up from R  0  9. Sitting, head tipped to L knee   10. Head up from L knee   11. Sitting,  head tipped to R knee   12. Head up from R knee   13. Sitting head turns x5 2  14.Sitting head nods x5 2  15. In stance, 180 turn to L    16. In stance, 180 turn to R     OTHOSTATICS: not done  FUNCTIONAL GAIT:  Rt single leg stance:   15"  ; Lt single leg stance: 15"     VESTIBULAR TREATMENT:                                                                                                   DATE: 03/26/2022 Habituation:  Seated Vertical Head Movements: symptom description: nauseated after 5, Seated Horizontal Head Movements: number of reps: 5 with increased nausea, and Compensatory Saccades: number of reps: 5 with increased nausea.    PATIENT EDUCATION: Education details: HEP Person educated: Patient Education method: Explanation Education comprehension: returned demonstration  Mignon Code: [email or web address] URL: [email or web address] Date: 03/26/2022 Prepared by: Rayetta Humphrey  Exercises - Seated Gaze Stabilization with Head Rotation  - 4-6 x daily - 7 x weekly - 1 sets - 3-10 reps - Seated Horizontal Smooth Pursuit  - 4-6 x daily - 7 x weekly - 1 sets - 3-10 reps - Seated Vertical Smooth Pursuit  - 4-6 x daily - 7 x weekly - 1 sets - 3-10 reps - Seated Gaze Stabilization with Head Nod  - 4-6 x daily - 7 x weekly - 1 sets - 3-10 reps  GOALS: Goals reviewed with patient? No  SHORT TERM GOALS: Target date: 04/16/2022   PT to be I in HEP in order to decrease her sx by 50% Baseline: Goal status: INITIAL   LONG TERM GOALS: Target date: 05/07/2022    Pt sx to be resolved 75%  Baseline:  Goal status: INITIAL  2.  PT to be able to single leg stance B for 30 seconds to reduce risk of falling.  Baseline:  Goal status: INITIAL 3.  Pt to state that she is able to go up and down her steps with light rail assist in confidence.             Goal status:  Initial    ASSESSMENT:  CLINICAL IMPRESSION: Patient is a 51 y.o. female who was  seen  today for physical therapy evaluation and treatment for dizziness. Cannalith testing for anterior, posterior and horizontal canals were (-).  Saccades and eye gaze exercises made pt feel nauseated.  Pt will benefit from habituation exercises for eye and body motion and balance to decrease her sx.   OBJECTIVE IMPAIRMENTS: dizziness.   ACTIVITY LIMITATIONS: stairs and locomotion level  PARTICIPATION LIMITATIONS: community activity  PERSONAL FACTORS: Time since onset of injury/illness/exacerbation are also affecting patient's functional outcome.   REHAB POTENTIAL: Good  CLINICAL DECISION MAKING: Stable/uncomplicated  EVALUATION COMPLEXITY: Moderate   PLAN:  PT FREQUENCY: 2x/week  PT DURATION: 6 weeks  PLANNED INTERVENTIONS: Therapeutic exercises, Therapeutic activity, Neuromuscular re-education, Balance training, Patient/Family education, Self Care, Canalith repositioning, and Manual therapy  PLAN FOR NEXT SESSION: Begin nose to knee and sit to supine/supine to sit motion for habituation.  Progress smooth pursuits and eye gaze to standing if able.  Then progress to walking and standing 180 and 360 body turns.    Virgina Organ, PT CLT [phone]  03/26/2022, 1645 PM

## 2022-04-10 ENCOUNTER — Ambulatory Visit (HOSPITAL_COMMUNITY): Payer: BC Managed Care – PPO | Admitting: Physical Therapy

## 2022-04-10 DIAGNOSIS — R42 Dizziness and giddiness: Secondary | ICD-10-CM | POA: Diagnosis not present

## 2022-04-10 NOTE — Therapy (Signed)
OUTPATIENT PHYSICAL THERAPY VESTIBULAR TREATMENT     Patient Name: Baylie Drakes MRN: [id number] DOB:11-10-1970, 51 y.o., female Today's Date: 04/10/2022   PT End of Session - 04/10/22 1110     Visit Number 2    Number of Visits 8    Date for PT Re-Evaluation 05/21/22    Authorization Type bcbs    Progress Note Due on Visit 8    PT Start Time 1110    PT Stop Time 1150    PT Time Calculation (min) 40 min    Activity Tolerance Patient tolerated treatment well    Behavior During Therapy [facility] for tasks assessed/performed             No past medical history on file. Past Surgical History:  Procedure Laterality Date   CHOLECYSTECTOMY     TONSILLECTOMY     TUBAL LIGATION     VARICOSE VEIN SURGERY     There are no problems to display for this patient.   PCP: Liliane Shi REFERRING PROVIDER: Newman Pies  REFERRING DIAG:  Diagnosis Description  PT eval/tx for recurrent dizziness, left ear pain, left ear pressure    THERAPY DIAG:  Dizziness  ONSET DATE: 2020  Rationale for Evaluation and Treatment: Rehabilitation  SUBJECTIVE:   SUBJECTIVE STATEMENT: Has been doing HEP. Feels they are getting easier. Fullness in ear feels not as bad, but still has vertigo that comes and goes.   Eval: PT states that she becomes dizzy many times throughout the day.  Her left ear feels full she went to see Dr. Suszanne Conners and he stated that she has central vertigo.  She gets so dizzy that she feels like she is going to vomit.  When she looks at people it looks like they are oscillating, when she does the dishes and they clink the noise bothers her .  She has been trying to push through it but it is getting harder and harder.   Pt accompanied by: self  PERTINENT HISTORY: Jalexis Breed is a 51 y.o. female who reports left ear pain, ear fullness and felt a pop sound with occasional dizziness with quick head turns. Pt reports she is being having trouble with her ears since she had COVID in October  2022   PAIN:  Are you having pain? No  PRECAUTIONS: None  WEIGHT BEARING RESTRICTIONS: No  FALLS: Has patient fallen in last 6 months? No  LIVING ENVIRONMENT: Lives with: lives with their family Lives in: House/apartment Stairs: Yes: Internal: 13 steps; on right going up pt states that she has a death grip on the rail. Has following equipment at home: None  PLOF: Independent  PATIENT GOALS: less dizziness   OBJECTIVE:   DIAGNOSTIC FINDINGS: CT of brainElectronically Signed   By: Marlan Palau M.D.   On: 03/18/2021 10:56 Overall cognitive status: Within functional limits for tasks assessed    COGNITION:unremarkable       POSTURE:  No Significant postural limitations  Cervical ROM:  wfl  VESTIBULAR ASSESSMENT:  GENERAL OBSERVATION: unremarkable    SYMPTOM BEHAVIOR:  Subjective history: see above  Non-Vestibular symptoms:  fullness in ears  , things look like they are vibrating   Type of dizziness: Spinning/Vertigo and "World moves"  Frequency: off and on all day long but she does not get dizzy driving.   Duration: a few minutes   Aggravating factors:  mornings   Relieving factors: rest  Progression of symptoms: unchanged  OCULOMOTOR EXAM:  Ocular Alignment: normal  Ocular ROM:  No Limitations  Spontaneous Nystagmus: absent  Gaze-Induced Nystagmus: absent things become blurry   Smooth Pursuits: intact but makes pt feel nauseated.   Saccades: intact  VESTIBULAR - OCULAR REFLEX:   Slow VOR: Normal  VOR Cancellation: Comment: become nauseated     POSITIONAL TESTING: Right Dix-Hallpike: no nystagmus Left Dix-Hallpike: no nystagmus Right Roll Test: no nystagmus Left Roll Test: no nystagmus  MOTION SENSITIVITY:  Motion Sensitivity Quotient Intensity: 0 = none, 1 = Lightheaded, 2 = Mild, 3 = Moderate, 4 = Severe, 5 = Vomiting  Intensity  1. Sitting to supine 1  2. Supine to L side   3. Supine to R side   4. Supine to sitting 3  5. L Hallpike-Dix 0   6. Up from L  0  7. R Hallpike-Dix 0  8. Up from R  0  9. Sitting, head tipped to L knee   10. Head up from L knee   11. Sitting, head tipped to R knee   12. Head up from R knee   13. Sitting head turns x5 2  14.Sitting head nods x5 2  15. In stance, 180 turn to L    16. In stance, 180 turn to R     OTHOSTATICS: not done  FUNCTIONAL GAIT:  Rt single leg stance:   15"  ; Lt single leg stance: 15"     VESTIBULAR TREATMENT:                                                                                                   DATE:    04/10/22 Seated head turn x20 no dizziness Seated head nod x20 no dizziness  Seated VOR x 1 horizontal and vertical 2 x 10 each (slight dizziness with horizontal) Smooth pursuit horizontal x20 (mod dizziness/ spinning)  Smooth pursuit vertical x 20 (no dizziness)  Saccades horizontal and vertical 2 x 10 each  (Increased dizziness vertical)   Nose to knee RT and LT 5 x each (no symptoms)  03/26/2022 Habituation:  Seated Vertical Head Movements: symptom description: nauseated after 5, Seated Horizontal Head Movements: number of reps: 5 with increased nausea, and Compensatory Saccades: number of reps: 5 with increased nausea.    PATIENT EDUCATION: Education details: HEP Person educated: Patient Education method: Explanation Education comprehension: returned demonstration  HOME EXERCISE PROGRAM:Access Code: [email or web address] URL: [email or web address] 04/10/22 - Seated Horizontal Saccades  - 4-6 x daily - 7 x weekly - 2-3 sets - 10 reps - Seated Vertical Saccades  - 4-6 x daily - 7 x weekly - 2-3 sets - 10 reps  Date: 03/26/2022 Prepared by: Virgina Organ  Exercises - Seated Gaze Stabilization with Head Rotation  - 4-6 x daily - 7 x weekly - 1 sets - 3-10 reps - Seated Horizontal Smooth Pursuit  - 4-6 x daily - 7 x weekly - 1 sets - 3-10 reps - Seated Vertical Smooth Pursuit  - 4-6 x daily - 7 x weekly - 1 sets - 3-10 reps - Seated  Gaze Stabilization with Head Nod  - 4-6 x daily - 7 x  weekly - 1 sets - 3-10 reps  GOALS: Goals reviewed with patient? No  SHORT TERM GOALS: Target date: 05/01/2022   PT to be I in HEP in order to decrease her sx by 50% Baseline: Goal status: INITIAL   LONG TERM GOALS: Target date: 05/22/2022    Pt sx to be resolved 75%  Baseline:  Goal status: INITIAL  2.  PT to be able to single leg stance B for 30 seconds to reduce risk of falling.  Baseline:  Goal status: INITIAL  3.  Pt to state that she is able to go up and down her steps with light rail assist in confidence.             Goal status:  Initial    ASSESSMENT:  CLINICAL IMPRESSION: Reviewed goals and HEP. Progressed seated habituation exercise. Patient tolerated well overall. Challenged with current level of activity describing array of symptoms. Symptoms generally worse with horizontal movement verus vertical. Symptoms range from, dizzy, blurred vision, nausea and feeling like eyes are "skipping" with saccades, though not observed. Updated HEP and issued handout. Patient will continue to benefit from skilled therapy services to reduce remaining deficits and improve functional ability.    OBJECTIVE IMPAIRMENTS: dizziness.   ACTIVITY LIMITATIONS: stairs and locomotion level  PARTICIPATION LIMITATIONS: community activity  PERSONAL FACTORS: Time since onset of injury/illness/exacerbation are also affecting patient's functional outcome.   REHAB POTENTIAL: Good  CLINICAL DECISION MAKING: Stable/uncomplicated  EVALUATION COMPLEXITY: Moderate   PLAN:  PT FREQUENCY: 2x/week  PT DURATION: 6 weeks  PLANNED INTERVENTIONS: Therapeutic exercises, Therapeutic activity, Neuromuscular re-education, Balance training, Patient/Family education, Self Care, Canalith repositioning, and Manual therapy  PLAN FOR NEXT SESSION: sit to supine/supine to sit motion for habituation.  Progress smooth pursuits and eye gaze to standing if  able. Then progress to walking and standing 180 and 360 body turns.   11:11 AM, 04/10/22 Georges Lynch PT DPT  Physical Therapist with [facility]  [phone]

## 2022-04-17 ENCOUNTER — Ambulatory Visit (HOSPITAL_COMMUNITY): Payer: BC Managed Care – PPO | Attending: Otolaryngology | Admitting: Physical Therapy

## 2022-04-17 DIAGNOSIS — R42 Dizziness and giddiness: Secondary | ICD-10-CM | POA: Diagnosis present

## 2022-04-17 NOTE — Therapy (Signed)
OUTPATIENT PHYSICAL THERAPY VESTIBULAR TREATMENT     Patient Name: Amy Hardy MRN: [id number] DOB:[birth year], 51 y.o., female Today's Date: 04/17/2022   PT End of Session - 04/17/22 1626     Visit Number 3    Number of Visits 8    Date for PT Re-Evaluation 05/21/22    Authorization Type bcbs    Progress Note Due on Visit 8    PT Start Time 1555    PT Stop Time 1625    PT Time Calculation (min) 30 min    Activity Tolerance Patient tolerated treatment well    Behavior During Therapy [facility] for tasks assessed/performed              No past medical history on file. Past Surgical History:  Procedure Laterality Date   CHOLECYSTECTOMY     TONSILLECTOMY     TUBAL LIGATION     VARICOSE VEIN SURGERY     There are no problems to display for this patient.   PCP: Marcene Brawn REFERRING PROVIDER: Leta Baptist  REFERRING DIAG:  Diagnosis Description  PT eval/tx for recurrent dizziness, left ear pain, left ear pressure    THERAPY DIAG:  Dizziness  ONSET DATE: 2020  Rationale for Evaluation and Treatment: Rehabilitation  SUBJECTIVE:   SUBJECTIVE STATEMENT: Pt states that she is feeling better.  She does not have the fullness that she does.  The exercises are not as nauseating as they having been.  PERTINENT HISTORY: Amy Hardy is a 51 y.o. female who reports left ear pain, ear fullness and felt a pop sound with occasional dizziness with quick head turns. Pt reports she is being having trouble with her ears since she had COVID in October 2022   PAIN:  Are you having pain? No  PRECAUTIONS: None  WEIGHT BEARING RESTRICTIONS: No  FALLS: Has patient fallen in last 6 months? No  LIVING ENVIRONMENT: Lives with: lives with their family Lives in: House/apartment Stairs: Yes: Internal: 13 steps; on right going up pt states that she has a death grip on the rail. Has following equipment at home: None  PLOF: Independent  PATIENT GOALS: less dizziness   OBJECTIVE:    DIAGNOSTIC FINDINGS: CT of brainElectronically Signed   By: Franchot Gallo M.D.   On: 03/18/2021 10:56 Overall cognitive status: Within functional limits for tasks assessed    COGNITION:unremarkable       POSTURE:  No Significant postural limitations  Cervical ROM:  wfl  VESTIBULAR ASSESSMENT:  GENERAL OBSERVATION: unremarkable    SYMPTOM BEHAVIOR:  Subjective history: see above  Non-Vestibular symptoms:  fullness in ears  , things look like they are vibrating   Type of dizziness: Spinning/Vertigo and "World moves"  Frequency: off and on all day long but she does not get dizzy driving.   Duration: a few minutes   Aggravating factors:  mornings   Relieving factors: rest  Progression of symptoms: unchanged  OCULOMOTOR EXAM:  Ocular Alignment: normal  Ocular ROM: No Limitations  Spontaneous Nystagmus: absent  Gaze-Induced Nystagmus: absent things become blurry   Smooth Pursuits: intact but makes pt feel nauseated.   Saccades: intact  VESTIBULAR - OCULAR REFLEX:   Slow VOR: Normal  VOR Cancellation: Comment: become nauseated     POSITIONAL TESTING: Right Dix-Hallpike: no nystagmus Left Dix-Hallpike: no nystagmus Right Roll Test: no nystagmus Left Roll Test: no nystagmus  MOTION SENSITIVITY:  Motion Sensitivity Quotient Intensity: 0 = none, 1 = Lightheaded, 2 = Mild, 3 = Moderate, 4 = Severe,  5 = Vomiting  Intensity 04/17/22  1. Sitting to supine 1 0  2. Supine to L side  2 and anxious   3. Supine to R side  1  4. Supine to sitting 3 0  5. L Hallpike-Dix 0   6. Up from L  0   7. R Hallpike-Dix 0   8. Up from R  0   9. Sitting, head tipped to L knee    10. Head up from L knee    11. Sitting, head tipped to R knee    12. Head up from R knee    13. Sitting head turns x5 2   14.Sitting head nods x5 2   15. In stance, 180 turn to L     16. In stance, 180 turn to R      OTHOSTATICS: not done  FUNCTIONAL GAIT:  Rt single leg stance:   15"  ; Lt single leg  stance: 15"     VESTIBULAR TREATMENT:                                                                                                   DATE:  04/17/22 Tested position changes:  long sit to supine no sx; supine to long sit no sx.   Supine to Lt side lying moderate sx began habituation . Supine to Rt slight sx  began habituation Sit to stand no sx  Stand:   Head turns right:  fine; head turns in front of a busy background moderate sx.  Gave as habituation Head turn left slight sx; in front of busy background moderate sx; gave as habituation. Single leg stance 04/10/22 Seated head turn x20 no dizziness Seated head nod x20 no dizziness  Seated VOR x 1 horizontal and vertical 2 x 10 each (slight dizziness with horizontal) Smooth pursuit horizontal x20 (mod dizziness/ spinning)  Smooth pursuit vertical x 20 (no dizziness)  Saccades horizontal and vertical 2 x 10 each  (Increased dizziness vertical)   Nose to knee RT and LT 5 x each (no symptoms)  03/26/2022 Habituation:  Seated Vertical Head Movements: symptom description: nauseated after 5, Seated Horizontal Head Movements: number of reps: 5 with increased nausea, and Compensatory Saccades: number of reps: 5 with increased nausea.    PATIENT EDUCATION: Education details: HEP Person educated: Patient Education method: Explanation Education comprehension: returned demonstration  [place] Code: [email or web address] URL: [email or web address] 04/17/22 - Supine to Long Sitting Vestibular Habituation  - 4-6 x daily - 7 x weekly - 1 sets - 10 reps - Supine to Right Sidelying Vestibular Habituation  - 4-6 x daily - 7 x weekly - 1 sets - 10 reps - Supine to Left Sidelying Vestibular Habituation  - 4-6 x daily - 7 x weekly - 1 sets - 10 reps - Standing with Head Rotation  - 4-6 x daily - 7 x weekly - 1 sets - 10 reps - Standing with Head Nod  - 4-6 x daily - 7 x weekly - 1 sets - 10 reps - Single Leg Stance with  Support  -  3 x daily - 7 x weekly - 1 sets - 3 reps 04/10/22 - Seated Horizontal Saccades  - 4-6 x daily - 7 x weekly - 2-3 sets - 10 reps - Seated Vertical Saccades  - 4-6 x daily - 7 x weekly - 2-3 sets - 10 reps  Date: 03/26/2022 Prepared by: Rayetta Humphrey  Exercises - Seated Gaze Stabilization with Head Rotation  - 4-6 x daily - 7 x weekly - 1 sets - 3-10 reps - Seated Horizontal Smooth Pursuit  - 4-6 x daily - 7 x weekly - 1 sets - 3-10 reps - Seated Vertical Smooth Pursuit  - 4-6 x daily - 7 x weekly - 1 sets - 3-10 reps - Seated Gaze Stabilization with Head Nod  - 4-6 x daily - 7 x weekly - 1 sets - 3-10 reps  GOALS: Goals reviewed with patient? No  SHORT TERM GOALS: Target date: 05/08/2022   PT to be I in HEP in order to decrease her sx by 50% Baseline: Goal status: MET   LONG TERM GOALS: Target date: 05/29/2022    Pt sx to be resolved 75%  Baseline:  Goal status: INITIAL  2.  PT to be able to single leg stance B for 30 seconds to reduce risk of falling.  Baseline:  Goal status: INITIAL  3.  Pt to state that she is able to go up and down her steps with light rail assist in confidence.             Goal status:  Initial    ASSESSMENT:  CLINICAL IMPRESSION: Pt sx continue to improve.  Advanced HEP to standing with busy back ground as well as adding single leg stance.  Updated HEP and issued handout. Patient will continue to benefit from skilled therapy services to reduce remaining deficits and improve functional ability.    OBJECTIVE IMPAIRMENTS: dizziness.   ACTIVITY LIMITATIONS: stairs and locomotion level  PARTICIPATION LIMITATIONS: community activity  PERSONAL FACTORS: Time since onset of injury/illness/exacerbation are also affecting patient's functional outcome.   REHAB POTENTIAL: Good  CLINICAL DECISION MAKING: Stable/uncomplicated  EVALUATION COMPLEXITY: Moderate   PLAN:  PT FREQUENCY: 2x/week  PT DURATION: 6 weeks  PLANNED INTERVENTIONS:  Therapeutic exercises, Therapeutic activity, Neuromuscular re-education, Balance training, Patient/Family education, Self Care, Canalith repositioning, and Manual therapy  PLAN FOR NEXT SESSION: Check 90 and 180 turns for sx.  If (+) give for HEP.   progress to walking head turns, saccades.   Rayetta Humphrey, [place] CLT [phone]  [phone]

## 2022-04-25 ENCOUNTER — Ambulatory Visit (HOSPITAL_COMMUNITY): Payer: BC Managed Care – PPO | Admitting: Physical Therapy

## 2022-04-25 DIAGNOSIS — R42 Dizziness and giddiness: Secondary | ICD-10-CM

## 2022-04-25 NOTE — Therapy (Signed)
OUTPATIENT PHYSICAL THERAPY VESTIBULAR TREATMENT     Patient Name: Amy Hardy MRN: [id number] DOB:Oct 21, 1970, 51 y.o., female Today's Date: 04/25/2022   PT End of Session - 04/25/22 1718     Visit Number 4    Number of Visits 8    Date for PT Re-Evaluation 05/21/22    Authorization Type bcbs    Progress Note Due on Visit 8    PT Start Time 1452    PT Stop Time 1517    PT Time Calculation (min) 25 min    Activity Tolerance Patient tolerated treatment well    Behavior During Therapy [facility] for tasks assessed/performed              No past medical history on file. Past Surgical History:  Procedure Laterality Date   CHOLECYSTECTOMY     TONSILLECTOMY     TUBAL LIGATION     VARICOSE VEIN SURGERY     There are no problems to display for this patient.   PCP: Marcene Brawn REFERRING PROVIDER: Leta Baptist  REFERRING DIAG:  Diagnosis Description  PT eval/tx for recurrent dizziness, left ear pain, left ear pressure    THERAPY DIAG:  Dizziness  ONSET DATE: 2020  Rationale for Evaluation and Treatment: Rehabilitation  SUBJECTIVE:   SUBJECTIVE STATEMENT: Pt states that she did not sleep well last night and she feels off today but on a whole she is feeling better.  Her Dizziness is 2-3 x /day.  PERTINENT HISTORY: Elanie Hammitt is a 51 y.o. female who reports left ear pain, ear fullness and felt a pop sound with occasional dizziness with quick head turns. Pt reports she is being having trouble with her ears since she had COVID in October 2022   PAIN:  Are you having pain? No  PRECAUTIONS: None  WEIGHT BEARING RESTRICTIONS: No  FALLS: Has patient fallen in last 6 months? No  LIVING ENVIRONMENT: Lives with: lives with their family Lives in: House/apartment Stairs: Yes: Internal: 13 steps; on right going up pt states that she has a death grip on the rail. Has following equipment at home: None  PLOF: Independent  PATIENT GOALS: less dizziness   OBJECTIVE:    DIAGNOSTIC FINDINGS: CT of brainElectronically Signed   By: Franchot Gallo M.D.   On: 03/18/2021 10:56 Overall cognitive status: Within functional limits for tasks assessed    COGNITION:unremarkable       POSTURE:  No Significant postural limitations  Cervical ROM:  wfl  VESTIBULAR ASSESSMENT:  GENERAL OBSERVATION: unremarkable    SYMPTOM BEHAVIOR:  Subjective history: see above  Non-Vestibular symptoms:  fullness in ears  , things look like they are vibrating   Type of dizziness: Spinning/Vertigo and "World moves"  Frequency: off and on all day long but she does not get dizzy driving.   Duration: a few minutes   Aggravating factors:  mornings   Relieving factors: rest  Progression of symptoms: unchanged  OCULOMOTOR EXAM:  Ocular Alignment: normal  Ocular ROM: No Limitations  Spontaneous Nystagmus: absent  Gaze-Induced Nystagmus: absent things become blurry   Smooth Pursuits: intact but makes pt feel nauseated.   Saccades: intact  VESTIBULAR - OCULAR REFLEX:   Slow VOR: Normal  VOR Cancellation: Comment: become nauseated     POSITIONAL TESTING: Right Dix-Hallpike: no nystagmus Left Dix-Hallpike: no nystagmus Right Roll Test: no nystagmus Left Roll Test: no nystagmus  MOTION SENSITIVITY:  Motion Sensitivity Quotient Intensity: 0 = none, 1 = Lightheaded, 2 = Mild, 3 = Moderate, 4 =  Severe, 5 = Vomiting  Intensity 04/17/22  1. Sitting to supine 1 0  2. Supine to L side  2 and anxious   3. Supine to R side  1  4. Supine to sitting 3 0  5. L Hallpike-Dix 0   6. Up from L  0   7. R Hallpike-Dix 0   8. Up from R  0   9. Sitting, head tipped to L knee    10. Head up from L knee    11. Sitting, head tipped to R knee    12. Head up from R knee    13. Sitting head turns x5 2   14.Sitting head nods x5 2   15. In stance, 180 turn to L     16. In stance, 180 turn to R      OTHOSTATICS: not done  FUNCTIONAL GAIT:  Rt single leg stance:   15"  ; Lt single leg  stance: 15"     VESTIBULAR TREATMENT:                                                                                                   DATE:  04/25/22 Left 90 degree turn x 5 with mod to max sx Right 90 degree turn with min to mod sx Walking with gaze horizontal stabilization(eyes turn RT and LT) Walking with gaze vertical stabilization(eyes go up and down) 04/17/22 Tested position changes:  long sit to supine no sx; supine to long sit no sx.   Supine to Lt side lying moderate sx began habituation . Supine to Rt slight sx  began habituation Sit to stand no sx  Stand:   Head turns right:  fine; head turns in front of a busy background moderate sx.  Gave as habituation Head turn left slight sx; in front of busy background moderate sx; gave as habituation. Single leg stance 04/10/22 Seated head turn x20 no dizziness Seated head nod x20 no dizziness  Seated VOR x 1 horizontal and vertical 2 x 10 each (slight dizziness with horizontal) Smooth pursuit horizontal x20 (mod dizziness/ spinning)  Smooth pursuit vertical x 20 (no dizziness)  Saccades horizontal and vertical 2 x 10 each  (Increased dizziness vertical)   Nose to knee RT and LT 5 x each (no symptoms)  03/26/2022 Habituation:  Seated Vertical Head Movements: symptom description: nauseated after 5, Seated Horizontal Head Movements: number of reps: 5 with increased nausea, and Compensatory Saccades: number of reps: 5 with increased nausea.    PATIENT EDUCATION: Education details: HEP Person educated: Patient Education method: Explanation Education comprehension: returned demonstration  [place] Code: [email or web address] URL: [email or web address] 04/25/22 - Standing Quarter Turn with Counter Support  - 4-6 x daily - 7 x weekly - 1 sets - 5-10 reps - Walking Gaze Stabilization Head Rotation  - 4-6 x daily - 7 x weekly - 1 sets - 3 reps - Walking Gaze Stabilization Head Nod  - 4-6 x daily - 7 x weekly  - 1 sets - 3 reps 04/17/22 - Supine to Long Sitting Vestibular Habituation  -  4-6 x daily - 7 x weekly - 1 sets - 10 reps - Supine to Right Sidelying Vestibular Habituation  - 4-6 x daily - 7 x weekly - 1 sets - 10 reps - Supine to Left Sidelying Vestibular Habituation  - 4-6 x daily - 7 x weekly - 1 sets - 10 reps - Standing with Head Rotation  - 4-6 x daily - 7 x weekly - 1 sets - 10 reps - Standing with Head Nod  - 4-6 x daily - 7 x weekly - 1 sets - 10 reps - Single Leg Stance with Support  - 3 x daily - 7 x weekly - 1 sets - 3 reps 04/10/22 - Seated Horizontal Saccades  - 4-6 x daily - 7 x weekly - 2-3 sets - 10 reps - Seated Vertical Saccades  - 4-6 x daily - 7 x weekly - 2-3 sets - 10 reps  Date: 03/26/2022 Prepared by: Rayetta Humphrey  Exercises - Seated Gaze Stabilization with Head Rotation  - 4-6 x daily - 7 x weekly - 1 sets - 3-10 reps - Seated Horizontal Smooth Pursuit  - 4-6 x daily - 7 x weekly - 1 sets - 3-10 reps - Seated Vertical Smooth Pursuit  - 4-6 x daily - 7 x weekly - 1 sets - 3-10 reps - Seated Gaze Stabilization with Head Nod  - 4-6 x daily - 7 x weekly - 1 sets - 3-10 reps  GOALS: Goals reviewed with patient? No  SHORT TERM GOALS: Target date: 05/16/2022   PT to be I in HEP in order to decrease her sx by 50% Baseline: Goal status: MET   LONG TERM GOALS: Target date: 06/06/2022    Pt sx to be resolved 75%  Baseline:  Goal status: INITIAL  2.  PT to be able to single leg stance B for 30 seconds to reduce risk of falling.  Baseline:  Goal status: INITIAL  3.  Pt to state that she is able to go up and down her steps with light rail assist in confidence.             Goal status:  Initial    ASSESSMENT:  CLINICAL IMPRESSION: Pt sx continue to improve.  Advanced HEP to turning and walking activity.  Updated HEP and issued handout. Patient will continue to benefit from skilled therapy services to reduce remaining deficits and improve functional ability.     OBJECTIVE IMPAIRMENTS: dizziness.   ACTIVITY LIMITATIONS: stairs and locomotion level  PARTICIPATION LIMITATIONS: community activity  PERSONAL FACTORS: Time since onset of injury/illness/exacerbation are also affecting patient's functional outcome.   REHAB POTENTIAL: Good  CLINICAL DECISION MAKING: Stable/uncomplicated  EVALUATION COMPLEXITY: Moderate   PLAN:  PT FREQUENCY: 2x/week  PT DURATION: 6 weeks  PLANNED INTERVENTIONS: Therapeutic exercises, Therapeutic activity, Neuromuscular re-education, Balance training, Patient/Family education, Self Care, Canalith repositioning, and Manual therapy  PLAN FOR NEXT SESSION: Check 180 turns for sx.  If (+) give for HEP.   progress to walking head turns, and retro gait activity.   Rayetta Humphrey, [place] [phone]  [phone]

## 2022-04-30 ENCOUNTER — Ambulatory Visit (HOSPITAL_COMMUNITY): Payer: BC Managed Care – PPO | Admitting: Physical Therapy

## 2022-04-30 DIAGNOSIS — R42 Dizziness and giddiness: Secondary | ICD-10-CM

## 2022-04-30 NOTE — Therapy (Signed)
OUTPATIENT PHYSICAL THERAPY VESTIBULAR TREATMENT     Patient Name: Amy Hardy MRN: [id number] DOB:06/08/1970, 51 y.o., female Today's Date: 04/25/2022   PT End of Session - 04/25/22 1718     Visit Number 4    Number of Visits 8    Date for PT Re-Evaluation 05/21/22    Authorization Type bcbs    Progress Note Due on Visit 8    PT Start Time 1452    PT Stop Time 1517    PT Time Calculation (min) 25 min    Activity Tolerance Patient tolerated treatment well    Behavior During Therapy [facility] for tasks assessed/performed              No past medical history on file. Past Surgical History:  Procedure Laterality Date   CHOLECYSTECTOMY     TONSILLECTOMY     TUBAL LIGATION     VARICOSE VEIN SURGERY     There are no problems to display for this patient.   PCP: Marcene Brawn REFERRING PROVIDER: Leta Baptist  REFERRING DIAG:  Diagnosis Description  PT eval/tx for recurrent dizziness, left ear pain, left ear pressure    THERAPY DIAG:  Dizziness  ONSET DATE: 2020  Rationale for Evaluation and Treatment: Rehabilitation  SUBJECTIVE:   SUBJECTIVE STATEMENT: Pt states that she thought that she was progressing well but then she went into [employer] and did some Christmas shopping and she felt the strange feeling come over her.  She states that she almost had a panic attack. This happened on the 17th and it took her all evening to settle down.   PERTINENT HISTORY: Amy Hardy is a 51 y.o. female who reports left ear pain, ear fullness and felt a pop sound with occasional dizziness with quick head turns. Pt reports she is being having trouble with her ears since she had COVID in October 2022   PAIN:  Are you having pain? No  PRECAUTIONS: None  WEIGHT BEARING RESTRICTIONS: No  FALLS: Has patient fallen in last 6 months? No  LIVING ENVIRONMENT: Lives with: lives with their family Lives in: House/apartment Stairs: Yes: Internal: 13 steps; on right going up pt states that she  has a death grip on the rail. Has following equipment at home: None  PLOF: Independent  PATIENT GOALS: less dizziness   OBJECTIVE:   DIAGNOSTIC FINDINGS: CT of brainElectronically Signed   By: Franchot Gallo M.D.   On: 03/18/2021 10:56 Overall cognitive status: Within functional limits for tasks assessed    COGNITION:unremarkable       POSTURE:  No Significant postural limitations  Cervical ROM:  wfl  VESTIBULAR ASSESSMENT:  GENERAL OBSERVATION: unremarkable    SYMPTOM BEHAVIOR:  Subjective history: see above  Non-Vestibular symptoms:  fullness in ears  , things look like they are vibrating   Type of dizziness: Spinning/Vertigo and "World moves"  Frequency: off and on all day long but she does not get dizzy driving.   Duration: a few minutes   Aggravating factors:  mornings   Relieving factors: rest  Progression of symptoms: unchanged  OCULOMOTOR EXAM:  Ocular Alignment: normal  Ocular ROM: No Limitations  Spontaneous Nystagmus: absent  Gaze-Induced Nystagmus: absent things become blurry   Smooth Pursuits: intact but makes pt feel nauseated.   Saccades: intact  VESTIBULAR - OCULAR REFLEX:   Slow VOR: Normal  VOR Cancellation: Comment: become nauseated     POSITIONAL TESTING: Right Dix-Hallpike: no nystagmus Left Dix-Hallpike: no nystagmus Right Roll Test: no nystagmus Left  Roll Test: no nystagmus  MOTION SENSITIVITY:  Motion Sensitivity Quotient Intensity: 0 = none, 1 = Lightheaded, 2 = Mild, 3 = Moderate, 4 = Severe, 5 = Vomiting  Intensity 04/17/22  1. Sitting to supine 1 0  2. Supine to L side  2 and anxious   3. Supine to R side  1  4. Supine to sitting 3 0  5. L Hallpike-Dix 0   6. Up from L  0   7. R Hallpike-Dix 0   8. Up from R  0   9. Sitting, head tipped to L knee    10. Head up from L knee    11. Sitting, head tipped to R knee    12. Head up from R knee    13. Sitting head turns x5 2   14.Sitting head nods x5 2   15. In stance, 180  turn to L     16. In stance, 180 turn to R      OTHOSTATICS: not done  FUNCTIONAL GAIT:  Rt single leg stance:   15"  ; Lt single leg stance: 15"     VESTIBULAR TREATMENT:                                                                                                   DATE:  05/01/23 180 turns x 5 B Retro gait 2 x down hallway Waking down the hall with 1/4 head turns x 1 RT Walking down the hall with 1/4 head nods x 1 Rt  Sit to stand quickly x 10  Vector stance 3" x 3 reps  04/25/22 Left 90 degree turn x 5 with mod to max sx Right 90 degree turn with min to mod sx Walking with gaze horizontal stabilization(eyes turn RT and LT) Walking with gaze vertical stabilization(eyes go up and down) 04/17/22 Tested position changes:  long sit to supine no sx; supine to long sit no sx.   Supine to Lt side lying moderate sx began habituation . Supine to Rt slight sx  began habituation Sit to stand no sx  Stand:   Head turns right:  fine; head turns in front of a busy background moderate sx.  Gave as habituation Head turn left slight sx; in front of busy background moderate sx; gave as habituation. Single leg stance 04/10/22 Seated head turn x20 no dizziness Seated head nod x20 no dizziness  Seated VOR x 1 horizontal and vertical 2 x 10 each (slight dizziness with horizontal) Smooth pursuit horizontal x20 (mod dizziness/ spinning)  Smooth pursuit vertical x 20 (no dizziness)  Saccades horizontal and vertical 2 x 10 each  (Increased dizziness vertical)   Nose to knee RT and LT 5 x each (no symptoms)  03/26/2022 Habituation:  Seated Vertical Head Movements: symptom description: nauseated after 5, Seated Horizontal Head Movements: number of reps: 5 with increased nausea, and Compensatory Saccades: number of reps: 5 with increased nausea.    PATIENT EDUCATION: Education details: HEP Person educated: Patient Education method: Explanation Education comprehension: returned  demonstration  [place] Code: [email or web address] URL:  [email or web address] Access Code: [email or web address] URL: [email or web address] Date: 04/30/2022 Prepared by: Rayetta Humphrey  Exercises -- Backward Walking Gaze Stabilization with Head Rotation (VOR I)  - 4 x daily - 7 x weekly - 1 sets - 1-2 reps - Single Leg Balance with Opposite Leg Star Reach  - 1 x daily - 7 x weekly - 3 sets - 10 reps 04/25/22 - Standing Quarter Turn with Counter Support  - 4-6 x daily - 7 x weekly - 1 sets - 5-10 reps - Walking Gaze Stabilization Head Rotation  - 4-6 x daily - 7 x weekly - 1 sets - 3 reps - Walking Gaze Stabilization Head Nod  - 4-6 x daily - 7 x weekly - 1 sets - 3 reps 04/17/22 - Supine to Long Sitting Vestibular Habituation  - 4-6 x daily - 7 x weekly - 1 sets - 10 reps - Supine to Right Sidelying Vestibular Habituation  - 4-6 x daily - 7 x weekly - 1 sets - 10 reps - Supine to Left Sidelying Vestibular Habituation  - 4-6 x daily - 7 x weekly - 1 sets - 10 reps - Standing with Head Rotation  - 4-6 x daily - 7 x weekly - 1 sets - 10 reps - Standing with Head Nod  - 4-6 x daily - 7 x weekly - 1 sets - 10 reps - Single Leg Stance with Support  - 3 x daily - 7 x weekly - 1 sets - 3 reps 04/10/22 - Seated Horizontal Saccades  - 4-6 x daily - 7 x weekly - 2-3 sets - 10 reps - Seated Vertical Saccades  - 4-6 x daily - 7 x weekly - 2-3 sets - 10 reps  Date: 03/26/2022 Prepared by: Rayetta Humphrey  Exercises - Seated Gaze Stabilization with Head Rotation  - 4-6 x daily - 7 x weekly - 1 sets - 3-10 reps - Seated Horizontal Smooth Pursuit  - 4-6 x daily - 7 x weekly - 1 sets - 3-10 reps - Seated Vertical Smooth Pursuit  - 4-6 x daily - 7 x weekly - 1 sets - 3-10 reps - Seated Gaze Stabilization with Head Nod  - 4-6 x daily - 7 x weekly - 1 sets - 3-10 reps  GOALS: Goals reviewed with patient? No  SHORT TERM GOALS: Target date: 05/16/2022   PT to be I in HEP  in order to decrease her sx by 50% Baseline: Goal status: MET   LONG TERM GOALS: Target date: 06/06/2022    Pt sx to be resolved 75%  Baseline:  Goal status: INITIAL  2.  PT to be able to single leg stance B for 30 seconds to reduce risk of falling.  Baseline:  Goal status: INITIAL  3.  Pt to state that she is able to go up and down her steps with light rail assist in confidence.             Goal status:  Initial    ASSESSMENT:  CLINICAL IMPRESSION: Reassured pt that small set backs are normal.  Therapist then advanced vestibular by adding 180 turns, vector stances as well as retro gait.  Pt continues to progress and is more confident completing her ADL's.  Pt will continue to benefit from skilled PT to return to her prior functioning status.     OBJECTIVE IMPAIRMENTS: dizziness.   ACTIVITY LIMITATIONS: stairs and locomotion level  PARTICIPATION LIMITATIONS: community activity  PERSONAL FACTORS: Time since onset of injury/illness/exacerbation are  also affecting patient's functional outcome.   REHAB POTENTIAL: Good  CLINICAL DECISION MAKING: Stable/uncomplicated  EVALUATION COMPLEXITY: Moderate   PLAN:  PT FREQUENCY: 2x/week  PT DURATION: 6 weeks  PLANNED INTERVENTIONS: Therapeutic exercises, Therapeutic activity, Neuromuscular re-education, Balance training, Patient/Family education, Self Care, Canalith repositioning, and Manual therapy  PLAN FOR NEXT SESSION: Check 180 turns for sx.  If (+) give for HEP.   progress to walking head turns, and retro gait activity.   Rayetta Humphrey, [place] [phone]  [phone]

## 2022-05-09 ENCOUNTER — Encounter (HOSPITAL_COMMUNITY): Payer: BC Managed Care – PPO | Admitting: Physical Therapy

## 2022-05-14 ENCOUNTER — Ambulatory Visit (HOSPITAL_COMMUNITY): Payer: BC Managed Care – PPO | Attending: Otolaryngology | Admitting: Physical Therapy

## 2022-05-14 ENCOUNTER — Encounter (HOSPITAL_COMMUNITY): Payer: Self-pay | Admitting: Physical Therapy

## 2022-05-14 DIAGNOSIS — R42 Dizziness and giddiness: Secondary | ICD-10-CM | POA: Insufficient documentation

## 2022-05-14 NOTE — Therapy (Unsigned)
OUTPATIENT PHYSICAL THERAPY VESTIBULAR TREATMENT     Patient Name: Amy Hardy MRN: [id number] DOB:31-Dec-1970, 52 y.o., female Today's Date: 04/25/2022  END OF SESSION:   PT End of Session - 05/14/22 1649     Visit Number 6    Number of Visits 8    Date for PT Re-Evaluation 05/21/22    Authorization Type bcbs    Progress Note Due on Visit 8    PT Start Time 9735    PT Stop Time 1725    PT Time Calculation (min) 38 min    Activity Tolerance Patient tolerated treatment well    Behavior During Therapy [facility] for tasks assessed/performed              No past medical history on file. Past Surgical History:  Procedure Laterality Date   CHOLECYSTECTOMY     TONSILLECTOMY     TUBAL LIGATION     VARICOSE VEIN SURGERY     There are no problems to display for this patient.   PCP: Marcene Brawn REFERRING PROVIDER: Leta Baptist  REFERRING DIAG:  Diagnosis Description  PT eval/tx for recurrent dizziness, left ear pain, left ear pressure    THERAPY DIAG:  Dizziness  ONSET DATE: 2020  Rationale for Evaluation and Treatment: Rehabilitation  SUBJECTIVE:   SUBJECTIVE STATEMENT: Didn't do exercise last week. Had to have a medical procedure. Still having issues with dizziness. Feels she moves to fast which contributes to symptoms.    PERTINENT HISTORY: Amy Hardy is a 52 y.o. female who reports left ear pain, ear fullness and felt a pop sound with occasional dizziness with quick head turns. Pt reports she is being having trouble with her ears since she had COVID in October 2022   PAIN:  Are you having pain? No  PRECAUTIONS: None  WEIGHT BEARING RESTRICTIONS: No  FALLS: Has patient fallen in last 6 months? No  LIVING ENVIRONMENT: Lives with: lives with their family Lives in: House/apartment Stairs: Yes: Internal: 13 steps; on right going up pt states that she has a death grip on the rail. Has following equipment at home: None  PLOF: Independent  PATIENT GOALS:  less dizziness   OBJECTIVE:   DIAGNOSTIC FINDINGS: CT of brainElectronically Signed   By: Franchot Gallo M.D.   On: 03/18/2021 10:56 Overall cognitive status: Within functional limits for tasks assessed    COGNITION:unremarkable       POSTURE:  No Significant postural limitations  Cervical ROM:  wfl  VESTIBULAR ASSESSMENT:  GENERAL OBSERVATION: unremarkable    SYMPTOM BEHAVIOR:  Subjective history: see above  Non-Vestibular symptoms:  fullness in ears  , things look like they are vibrating   Type of dizziness: Spinning/Vertigo and "World moves"  Frequency: off and on all day long but she does not get dizzy driving.   Duration: a few minutes   Aggravating factors:  mornings   Relieving factors: rest  Progression of symptoms: unchanged  OCULOMOTOR EXAM:  Ocular Alignment: normal  Ocular ROM: No Limitations  Spontaneous Nystagmus: absent  Gaze-Induced Nystagmus: absent things become blurry   Smooth Pursuits: intact but makes pt feel nauseated.   Saccades: intact  VESTIBULAR - OCULAR REFLEX:   Slow VOR: Normal  VOR Cancellation: Comment: become nauseated     POSITIONAL TESTING: Right Dix-Hallpike: no nystagmus Left Dix-Hallpike: no nystagmus Right Roll Test: no nystagmus Left Roll Test: no nystagmus  MOTION SENSITIVITY:  Motion Sensitivity Quotient Intensity: 0 = none, 1 = Lightheaded, 2 = Mild, 3 = Moderate,  4 = Severe, 5 = Vomiting  Intensity 04/17/22  1. Sitting to supine 1 0  2. Supine to L side  2 and anxious   3. Supine to R side  1  4. Supine to sitting 3 0  5. L Hallpike-Dix 0   6. Up from L  0   7. R Hallpike-Dix 0   8. Up from R  0   9. Sitting, head tipped to L knee    10. Head up from L knee    11. Sitting, head tipped to R knee    12. Head up from R knee    13. Sitting head turns x5 2   14.Sitting head nods x5 2   15. In stance, 180 turn to L     16. In stance, 180 turn to R      OTHOSTATICS: not done  FUNCTIONAL GAIT:  Rt single leg  stance:   15"  ; Lt single leg stance: 15"     VESTIBULAR TREATMENT:                                                                                                   DATE:  05/14/22 Seated head turns x 10 (increased)  Seated head nods x 10 (no sx)  Retested head turns x10 (decreased sx) Sit to stand x 10 (no sx)   Gait with head turns 2RT Gait with head nods 2RT Tandem gait 2 RT Tandem stance with head turns x 15 each  SLS vectors 3 x 5" holds HHA x 1  Standing VOR x 1 2  x10  05/01/23 180 turns x 5 B Retro gait 2 x down hallway Waking down the hall with 1/4 head turns x 1 RT Walking down the hall with 1/4 head nods x 1 Rt  Sit to stand quickly x 10  Vector stance 3" x 3 reps   04/25/22 Left 90 degree turn x 5 with mod to max sx Right 90 degree turn with min to mod sx Walking with gaze horizontal stabilization(eyes turn RT and LT) Walking with gaze vertical stabilization(eyes go up and down) 04/17/22 Tested position changes:  long sit to supine no sx; supine to long sit no sx.   Supine to Lt side lying moderate sx began habituation . Supine to Rt slight sx  began habituation Sit to stand no sx  Stand:   Head turns right:  fine; head turns in front of a busy background moderate sx.  Gave as habituation Head turn left slight sx; in front of busy background moderate sx; gave as habituation. Single leg stance 04/10/22 Seated head turn x20 no dizziness Seated head nod x20 no dizziness  Seated VOR x 1 horizontal and vertical 2 x 10 each (slight dizziness with horizontal) Smooth pursuit horizontal x20 (mod dizziness/ spinning)  Smooth pursuit vertical x 20 (no dizziness)  Saccades horizontal and vertical 2 x 10 each  (Increased dizziness vertical)   Nose to knee RT and LT 5 x each (no symptoms)  03/26/2022 Habituation:  Seated Vertical Head Movements: symptom description: nauseated  after 5, Seated Horizontal Head Movements: number of reps: 5 with increased nausea, and  Compensatory Saccades: number of reps: 5 with increased nausea.    PATIENT EDUCATION: Education details: HEP Person educated: Patient Education method: Explanation Education comprehension: returned demonstration  [place] Code: [email or web address] URL: [email or web address] Access Code: [email or web address] URL: [email or web address]  05/14/22 - Tandem Stance with Head Rotation  - 2 x daily - 7 x weekly - 2 sets - 10 reps - Standing Gaze Stabilization with Head Rotation  - 2 x daily - 7 x weekly - 2 sets - 10 reps  Date: 04/30/2022 Prepared by: Rayetta Humphrey  Exercises -- Backward Walking Gaze Stabilization with Head Rotation (VOR I)  - 4 x daily - 7 x weekly - 1 sets - 1-2 reps - Single Leg Balance with Opposite Leg Star Reach  - 1 x daily - 7 x weekly - 3 sets - 10 reps 04/25/22 - Standing Quarter Turn with Counter Support  - 4-6 x daily - 7 x weekly - 1 sets - 5-10 reps - Walking Gaze Stabilization Head Rotation  - 4-6 x daily - 7 x weekly - 1 sets - 3 reps - Walking Gaze Stabilization Head Nod  - 4-6 x daily - 7 x weekly - 1 sets - 3 reps 04/17/22 - Supine to Long Sitting Vestibular Habituation  - 4-6 x daily - 7 x weekly - 1 sets - 10 reps - Supine to Right Sidelying Vestibular Habituation  - 4-6 x daily - 7 x weekly - 1 sets - 10 reps - Supine to Left Sidelying Vestibular Habituation  - 4-6 x daily - 7 x weekly - 1 sets - 10 reps - Standing with Head Rotation  - 4-6 x daily - 7 x weekly - 1 sets - 10 reps - Standing with Head Nod  - 4-6 x daily - 7 x weekly - 1 sets - 10 reps - Single Leg Stance with Support  - 3 x daily - 7 x weekly - 1 sets - 3 reps 04/10/22 - Seated Horizontal Saccades  - 4-6 x daily - 7 x weekly - 2-3 sets - 10 reps - Seated Vertical Saccades  - 4-6 x daily - 7 x weekly - 2-3 sets - 10 reps  Date: 03/26/2022 Prepared by: Rayetta Humphrey  Exercises - Seated Gaze Stabilization with Head Rotation  - 4-6 x daily - 7 x  weekly - 1 sets - 3-10 reps - Seated Horizontal Smooth Pursuit  - 4-6 x daily - 7 x weekly - 1 sets - 3-10 reps - Seated Vertical Smooth Pursuit  - 4-6 x daily - 7 x weekly - 1 sets - 3-10 reps - Seated Gaze Stabilization with Head Nod  - 4-6 x daily - 7 x weekly - 1 sets - 3-10 reps  GOALS: Goals reviewed with patient? No  SHORT TERM GOALS: Target date: 05/16/2022   PT to be I in HEP in order to decrease her sx by 50% Baseline: Goal status: MET   LONG TERM GOALS: Target date: 06/06/2022    Pt sx to be resolved 75%  Baseline:  Goal status: INITIAL  2.  PT to be able to single leg stance B for 30 seconds to reduce risk of falling.  Baseline:  Goal status: INITIAL  3.  Pt to state that she is able to go up and down her steps with light rail assist in confidence.  Goal status:  Initial    ASSESSMENT:  CLINICAL IMPRESSION:   Discussed possibility of anxiety overlay to dizziness presentation per patient subjective. Patient states she does notice some anxiety surrounding her dizzy spells and that this is likely a contributing factor. Continued with habituation progression to patient tolerance. Patient tolerated well overall. Symptoms that did present resolve rather quickly. Patient does well with added balance exercise. Updated HEP to include standing gaze stabilization and tandem balance with head turns and issued handout. Patient will continue to benefit from skilled therapy services to reduce remaining deficits and improve functional ability.     OBJECTIVE IMPAIRMENTS: dizziness.   ACTIVITY LIMITATIONS: stairs and locomotion level  PARTICIPATION LIMITATIONS: community activity  PERSONAL FACTORS: Time since onset of injury/illness/exacerbation are also affecting patient's functional outcome.   REHAB POTENTIAL: Good  CLINICAL DECISION MAKING: Stable/uncomplicated  EVALUATION COMPLEXITY: Moderate   PLAN:  PT FREQUENCY: 2x/week  PT DURATION: 6 weeks  PLANNED  INTERVENTIONS: Therapeutic exercises, Therapeutic activity, Neuromuscular re-education, Balance training, Patient/Family education, Self Care, Canalith repositioning, and Manual therapy  PLAN FOR NEXT SESSION: Reassess. progress walking head turns, and retro gait activity.   4:49 PM, 05/14/22 Josue Hector PT DPT  Physical Therapist with [facility]  [phone]

## 2022-05-21 ENCOUNTER — Encounter (HOSPITAL_COMMUNITY): Payer: BC Managed Care – PPO | Admitting: Physical Therapy

## 2022-05-28 ENCOUNTER — Ambulatory Visit (HOSPITAL_COMMUNITY): Payer: BC Managed Care – PPO | Admitting: Physical Therapy

## 2022-05-28 DIAGNOSIS — R42 Dizziness and giddiness: Secondary | ICD-10-CM | POA: Diagnosis not present

## 2022-05-28 NOTE — Therapy (Signed)
OUTPATIENT PHYSICAL THERAPY VESTIBULAR TREATMENT     Patient Name: Amy Hardy MRN: [id number] DOB:[birth year], 52 y.o., female Today's Date: 05/28/2022  PHYSICAL THERAPY DISCHARGE SUMMARY  Visits from Start of Care: 7  Current functional level related to goals / functional outcomes: See below   Remaining deficits: See below    Education / Equipment: See assessment    Patient agrees to discharge. Patient goals were met. Patient is being discharged due to meeting the stated rehab goals.  END OF SESSION:   PT End of Session - 05/28/22 1650     Visit Number 7    Number of Visits 8    Date for PT Re-Evaluation 05/28/22    Authorization Type bcbs    Progress Note Due on Visit 8    PT Start Time 1640    PT Stop Time 1705    PT Time Calculation (min) 25 min    Activity Tolerance Patient tolerated treatment well    Behavior During Therapy [facility] for tasks assessed/performed              No past medical history on file. Past Surgical History:  Procedure Laterality Date   CHOLECYSTECTOMY     TONSILLECTOMY     TUBAL LIGATION     VARICOSE VEIN SURGERY     There are no problems to display for this patient.   PCP: Amy Hardy REFERRING PROVIDER: Newman Hardy  REFERRING DIAG:  Diagnosis Description  PT eval/tx for recurrent dizziness, left ear pain, left ear pressure    THERAPY DIAG:  Dizziness  ONSET DATE: 2020  Rationale for Evaluation and Treatment: Rehabilitation  SUBJECTIVE:   SUBJECTIVE STATEMENT: Feeling much better, reports 80% improved. Has not had any recent episodes of dizziness. Feels combination of exercises and lifestyle modification has helped significantly. She feels confident with vestibular HEP and feels ready for DC today.    PERTINENT HISTORY: Amy Hardy is a 52 y.o. female who reports left ear pain, ear fullness and felt a pop sound with occasional dizziness with quick head turns. Pt reports she is being having trouble with her ears  since she had COVID in October 2022   PAIN:  Are you having pain? No  PRECAUTIONS: None  WEIGHT BEARING RESTRICTIONS: No  FALLS: Has patient fallen in last 6 months? No  LIVING ENVIRONMENT: Lives with: lives with their family Lives in: House/apartment Stairs: Yes: Internal: 13 steps; on right going up pt states that she has a death grip on the rail. Has following equipment at home: None  PLOF: Independent  PATIENT GOALS: less dizziness   OBJECTIVE:   DIAGNOSTIC FINDINGS: CT of brainElectronically Signed   By: Amy Hardy M.D.   On: 03/18/2021 10:56 Overall cognitive status: Within functional limits for tasks assessed    COGNITION:unremarkable       POSTURE:  No Significant postural limitations  Cervical ROM:  wfl  VESTIBULAR ASSESSMENT:  GENERAL OBSERVATION: unremarkable    SYMPTOM BEHAVIOR:  Subjective history: see above  Non-Vestibular symptoms:  fullness in ears  , things look like they are vibrating   Type of dizziness: Spinning/Vertigo and "World moves"  Frequency: off and on all day long but she does not get dizzy driving.   Duration: a few minutes   Aggravating factors:  mornings   Relieving factors: rest  Progression of symptoms: unchanged  OCULOMOTOR EXAM:  Ocular Alignment: normal  Ocular ROM: No Limitations  Spontaneous Nystagmus: absent  Gaze-Induced Nystagmus: absent things become blurry   Smooth  Pursuits: intact but makes pt feel nauseated.   Saccades: intact  VESTIBULAR - OCULAR REFLEX:   Slow VOR: Normal  VOR Cancellation: Comment: become nauseated     POSITIONAL TESTING: Right Dix-Hallpike: no nystagmus Left Dix-Hallpike: no nystagmus Right Roll Test: no nystagmus Left Roll Test: no nystagmus  MOTION SENSITIVITY:  Motion Sensitivity Quotient Intensity: 0 = none, 1 = Lightheaded, 2 = Mild, 3 = Moderate, 4 = Severe, 5 = Vomiting  Intensity 04/17/22 05/28/22  1. Sitting to supine 1 0   2. Supine to L side  2 and anxious    3.  Supine to R side  1   4. Supine to sitting 3 0   5. L Hallpike-Dix 0    6. Up from L  0    7. R Hallpike-Dix 0    8. Up from R  0    9. Sitting, head tipped to L knee     10. Head up from L knee     11. Sitting, head tipped to R knee     12. Head up from R knee     13. Sitting head turns x5 2  0  14.Sitting head nods x5 2  0  15. In stance, 180 turn to L      16. In stance, 180 turn to R       OTHOSTATICS: not done  FUNCTIONAL GAIT:  Rt single leg stance: >30 sec (was 15")  ; Lt single leg stance: >30 sec (was 15")     VESTIBULAR TREATMENT:                                                                                                   DATE:  05/28/22 Standing head nods x 10 Standing head turns x 10 Semi tandem stance with head turns x 10  SLS >30 seconds bilateral  Stairs 7 inch 2 RT   05/14/22 Seated head turns x 10 (increased)  Seated head nods x 10 (no sx)  Retested head turns x10 (decreased sx) Sit to stand x 10 (no sx)   Gait with head turns 2RT Gait with head nods 2RT Tandem gait 2 RT Tandem stance with head turns x 15 each  SLS vectors 3 x 5" holds HHA x 1  Standing VOR x 1 2  x10  05/01/23 180 turns x 5 B Retro gait 2 x down hallway Waking down the hall with 1/4 head turns x 1 RT Walking down the hall with 1/4 head nods x 1 Rt  Sit to stand quickly x 10  Vector stance 3" x 3 reps   04/25/22 Left 90 degree turn x 5 with mod to max sx Right 90 degree turn with min to mod sx Walking with gaze horizontal stabilization(eyes turn RT and LT) Walking with gaze vertical stabilization(eyes go up and down) 04/17/22 Tested position changes:  long sit to supine no sx; supine to long sit no sx.   Supine to Lt side lying moderate sx began habituation . Supine to Rt slight sx  began habituation Sit to stand  no sx  Stand:   Head turns right:  fine; head turns in front of a busy background moderate sx.  Gave as habituation Head turn left slight sx; in front of  busy background moderate sx; gave as habituation. Single leg stance 04/10/22 Seated head turn x20 no dizziness Seated head nod x20 no dizziness  Seated VOR x 1 horizontal and vertical 2 x 10 each (slight dizziness with horizontal) Smooth pursuit horizontal x20 (mod dizziness/ spinning)  Smooth pursuit vertical x 20 (no dizziness)  Saccades horizontal and vertical 2 x 10 each  (Increased dizziness vertical)   Nose to knee RT and LT 5 x each (no symptoms)  03/26/2022 Habituation:  Seated Vertical Head Movements: symptom description: nauseated after 5, Seated Horizontal Head Movements: number of reps: 5 with increased nausea, and Compensatory Saccades: number of reps: 5 with increased nausea.    PATIENT EDUCATION: Education details: HEP Person educated: Patient Education method: Explanation Education comprehension: returned demonstration  HOME EXERCISE PROGRAM:Access Code: [email or web address] URL: [email or web address] Access Code: [email or web address] URL: [email or web address]  05/14/22 - Tandem Stance with Head Rotation  - 2 x daily - 7 x weekly - 2 sets - 10 reps - Standing Gaze Stabilization with Head Rotation  - 2 x daily - 7 x weekly - 2 sets - 10 reps  Date: 04/30/2022 Prepared by: Virgina Organ  Exercises -- Backward Walking Gaze Stabilization with Head Rotation (VOR I)  - 4 x daily - 7 x weekly - 1 sets - 1-2 reps - Single Leg Balance with Opposite Leg Star Reach  - 1 x daily - 7 x weekly - 3 sets - 10 reps 04/25/22 - Standing Quarter Turn with Counter Support  - 4-6 x daily - 7 x weekly - 1 sets - 5-10 reps - Walking Gaze Stabilization Head Rotation  - 4-6 x daily - 7 x weekly - 1 sets - 3 reps - Walking Gaze Stabilization Head Nod  - 4-6 x daily - 7 x weekly - 1 sets - 3 reps 04/17/22 - Supine to Long Sitting Vestibular Habituation  - 4-6 x daily - 7 x weekly - 1 sets - 10 reps - Supine to Right Sidelying Vestibular Habituation  - 4-6 x daily - 7 x  weekly - 1 sets - 10 reps - Supine to Left Sidelying Vestibular Habituation  - 4-6 x daily - 7 x weekly - 1 sets - 10 reps - Standing with Head Rotation  - 4-6 x daily - 7 x weekly - 1 sets - 10 reps - Standing with Head Nod  - 4-6 x daily - 7 x weekly - 1 sets - 10 reps - Single Leg Stance with Support  - 3 x daily - 7 x weekly - 1 sets - 3 reps 04/10/22 - Seated Horizontal Saccades  - 4-6 x daily - 7 x weekly - 2-3 sets - 10 reps - Seated Vertical Saccades  - 4-6 x daily - 7 x weekly - 2-3 sets - 10 reps  Date: 03/26/2022 Prepared by: Virgina Organ  Exercises - Seated Gaze Stabilization with Head Rotation  - 4-6 x daily - 7 x weekly - 1 sets - 3-10 reps - Seated Horizontal Smooth Pursuit  - 4-6 x daily - 7 x weekly - 1 sets - 3-10 reps - Seated Vertical Smooth Pursuit  - 4-6 x daily - 7 x weekly - 1 sets - 3-10 reps - Seated Gaze Stabilization with Head Nod  - 4-6 x daily -  7 x weekly - 1 sets - 3-10 reps  GOALS: Goals reviewed with patient? No  SHORT TERM GOALS: Target date: 06/18/2022   PT to be I in HEP in order to decrease her sx by 50% Baseline: Goal status: MET   LONG TERM GOALS: Target date: 07/09/2022    Pt sx to be resolved 75%  Baseline: Reports 80% reduction  Goal status: INITIAL  2.  PT to be able to single leg stance B for 30 seconds to reduce risk of falling.  Baseline:  Goal status: INITIAL  3.  Pt to state that she is able to go up and down her steps with light rail assist in confidence.   Baseline: Reports no issues with this            Goal status:  Initial    ASSESSMENT:  CLINICAL IMPRESSION:  Patient showing good progress overall, and has currently met all therapy goals. Patient reports significant subjective improvement and virtually no vestibular disturbance at present. Patient demos very good static standing balance and has no issues ambulating stairs independently. Reviewed HEP and answered all patient question. At this time, patient will be DC  to HEP with all goals met. Encouraged patient to follow up with therapy services with any further questions or concerns.    OBJECTIVE IMPAIRMENTS: dizziness.   ACTIVITY LIMITATIONS: stairs and locomotion level  PARTICIPATION LIMITATIONS: community activity  PERSONAL FACTORS: Time since onset of injury/illness/exacerbation are also affecting patient's functional outcome.   REHAB POTENTIAL: Good  CLINICAL DECISION MAKING: Stable/uncomplicated  EVALUATION COMPLEXITY: Moderate   PLAN:  PT FREQUENCY: 2x/week  PT DURATION: 6 weeks  PLANNED INTERVENTIONS: Therapeutic exercises, Therapeutic activity, Neuromuscular re-education, Balance training, Patient/Family education, Self Care, Canalith repositioning, and Manual therapy  PLAN FOR NEXT SESSION: DC to HEP   5:17 PM, 05/28/22 Josue Hector PT DPT  Physical Therapist with [facility]  [phone]

## 2022-06-02 IMAGING — CT CT HEAD W/O CM
3 series · 16 of 47 positions shown, 19 images · non-contrast
Comparison: None.

CLINICAL DATA: Dizziness, headache

EXAM:
CT HEAD WITHOUT CONTRAST
TECHNIQUE: Contiguous axial images were obtained from the base of the skull
through the vertex without intravenous contrast.

[Series 2: head w o · axial · 0.49mm/px · z∈[+1702,+1847]mm · 10 of 35 slices shown, 13 images]
[im 3/35  brain]
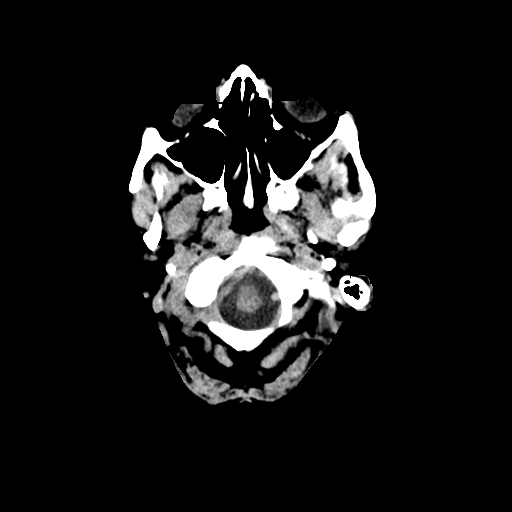
[im 3/35  bone]
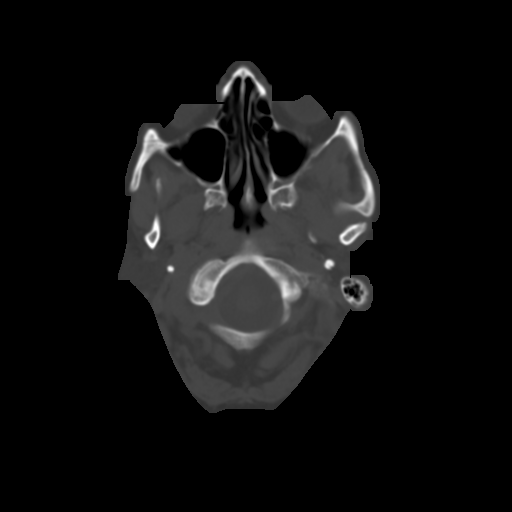
[im 6/35  brain]
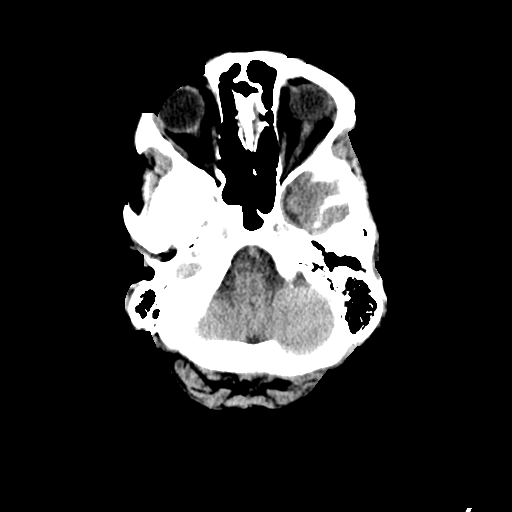
[im 10/35  brain]
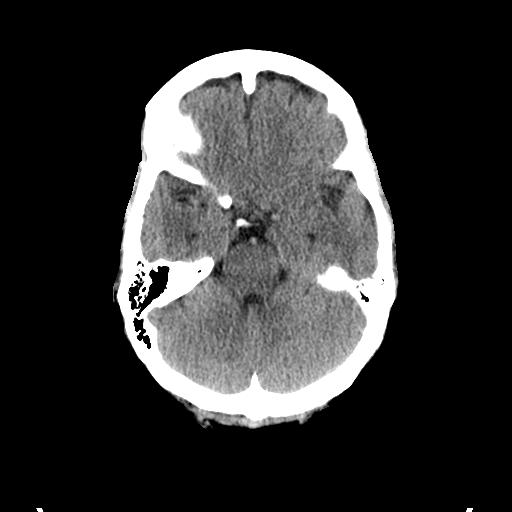
[im 12/35  brain]
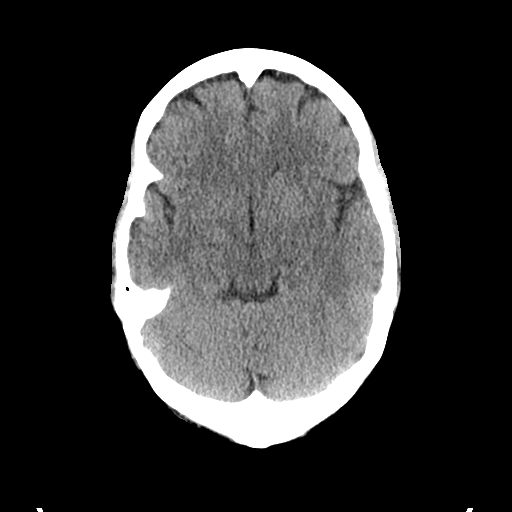
[im 16/35  brain]
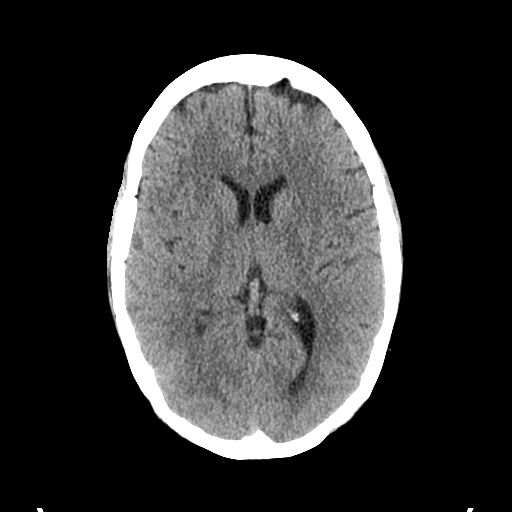
[im 16/35  bone]
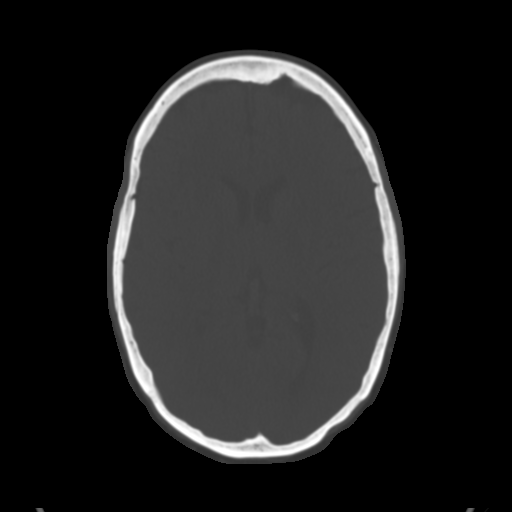
[im 19/35  brain]
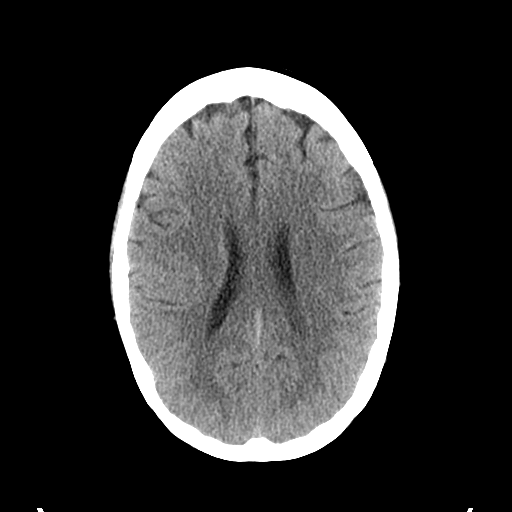
[im 23/35  brain]
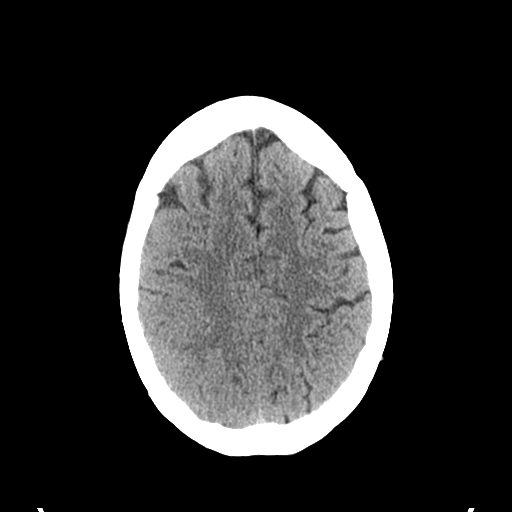
[im 26/35  brain]
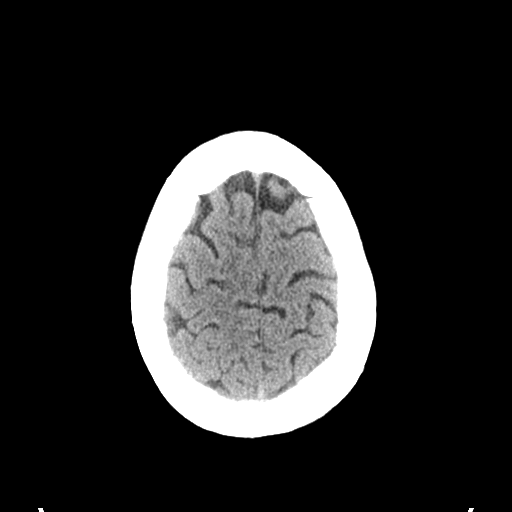
[im 29/35  brain]
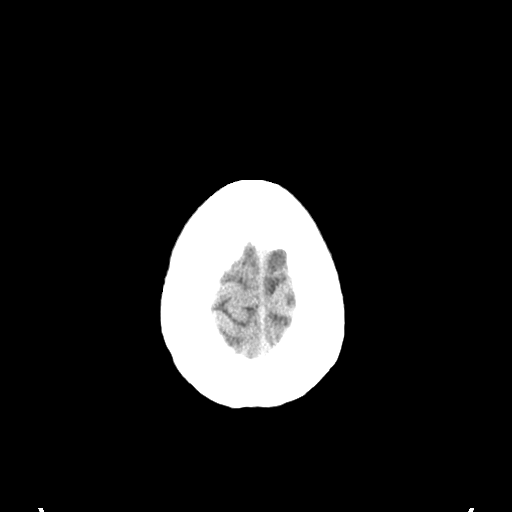
[im 29/35  bone]
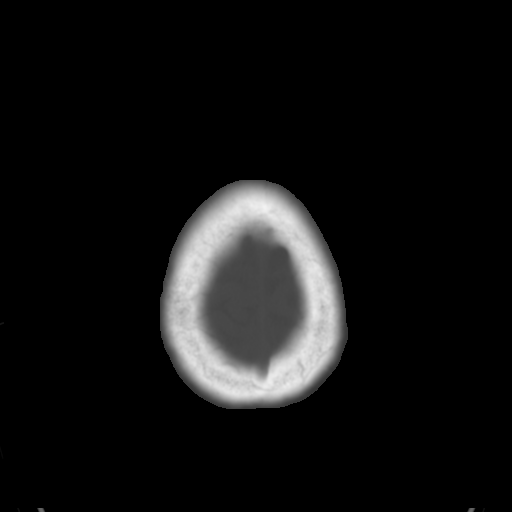
[im 32/35  brain]
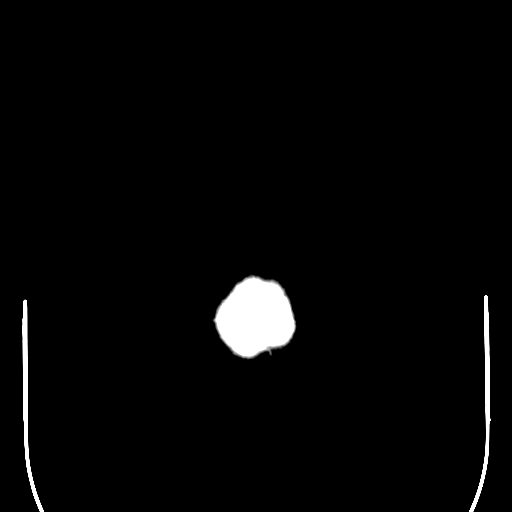

[Series 4: coronal soft · coronal · 0.34mm/px · 3 of 79 slices shown]
[im 27/79  brain]
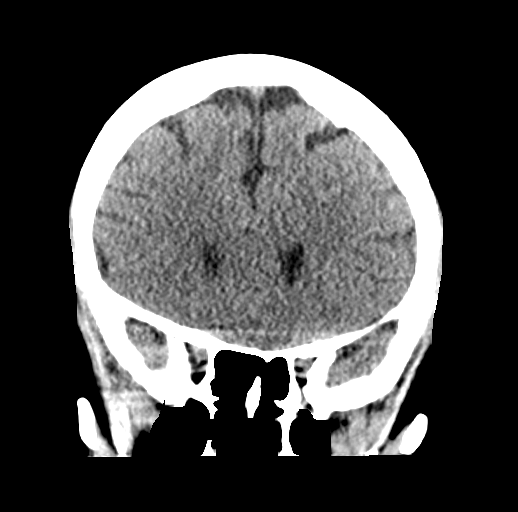
[im 35/79  brain]
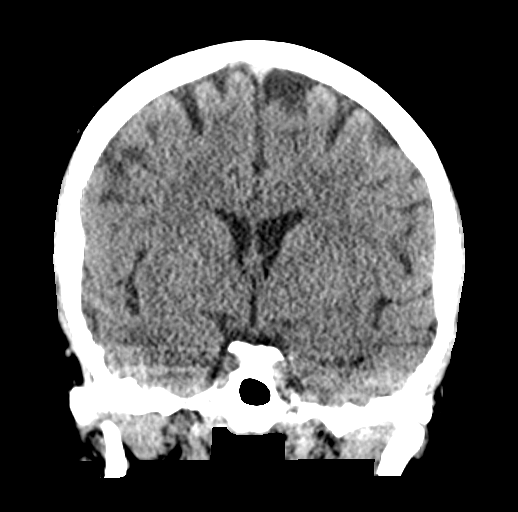
[im 44/79  brain]
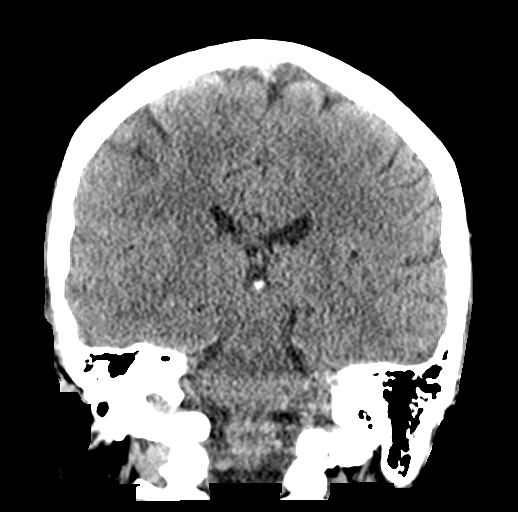

[Series 5: sagittal soft · sagittal · 0.36mm/px · 3 of 57 slices shown]
[im 19/57  brain]
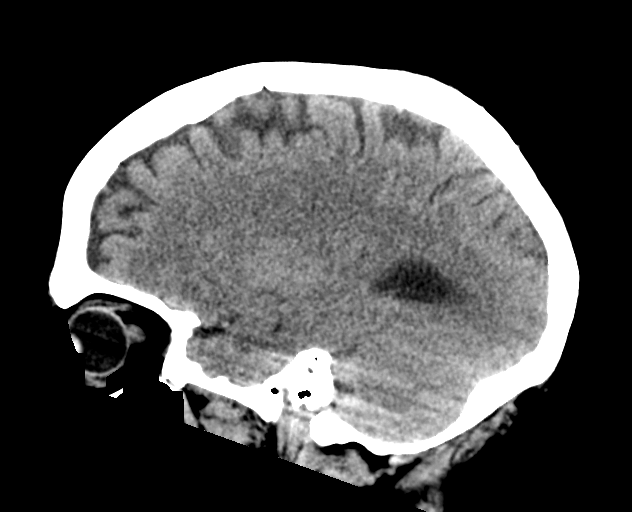
[im 29/57  brain]
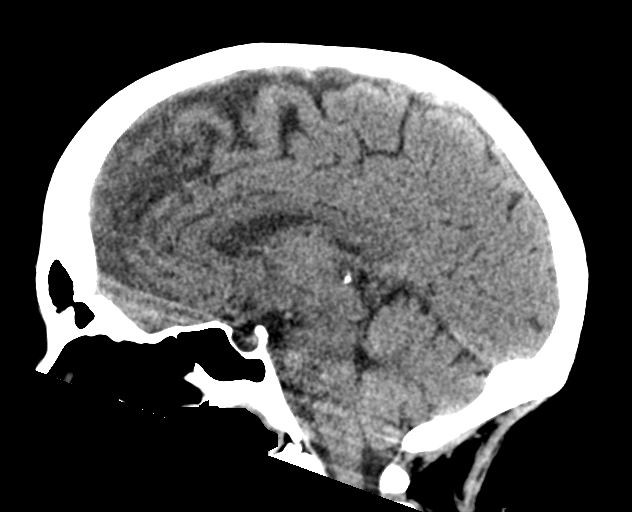
[im 38/57  brain]
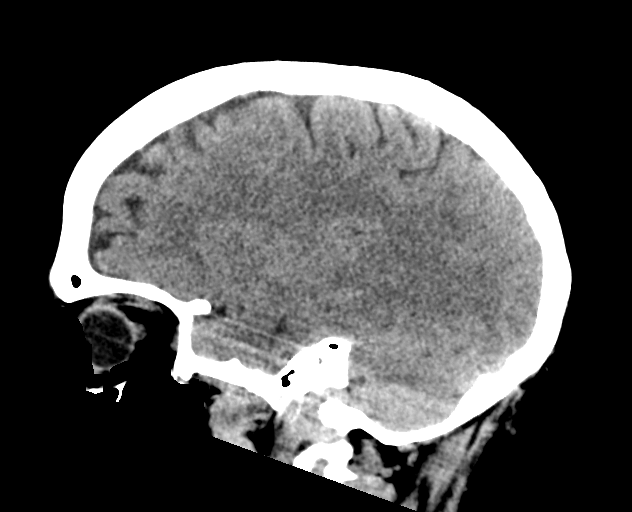

[16 of 47 positions shown; findings below may reference images not displayed]

FINDINGS: Brain: No evidence of acute infarction, hemorrhage, hydrocephalus,
extra-axial collection or mass lesion/mass effect.

Vascular: Negative for hyperdense vessel

Skull: Negative

Sinuses/Orbits: Negative

Other: None
IMPRESSION: Negative CT head

## 2022-08-13 ENCOUNTER — Other Ambulatory Visit: Payer: Self-pay

## 2022-08-13 ENCOUNTER — Emergency Department (HOSPITAL_COMMUNITY)
Admission: EM | Admit: 2022-08-13 | Discharge: 2022-08-14 | Disposition: A | Discharge status: Home / Self Care | Payer: BC Managed Care – PPO | Attending: Emergency Medicine | Admitting: Emergency Medicine

## 2022-08-13 ENCOUNTER — Emergency Department (HOSPITAL_COMMUNITY): Payer: BC Managed Care – PPO

## 2022-08-13 ENCOUNTER — Encounter (HOSPITAL_COMMUNITY): Payer: Self-pay

## 2022-08-13 DIAGNOSIS — N200 Calculus of kidney: Secondary | ICD-10-CM

## 2022-08-13 DIAGNOSIS — D72829 Elevated white blood cell count, unspecified: Secondary | ICD-10-CM | POA: Insufficient documentation

## 2022-08-13 DIAGNOSIS — Z85038 Personal history of other malignant neoplasm of large intestine: Secondary | ICD-10-CM | POA: Diagnosis not present

## 2022-08-13 DIAGNOSIS — Z87891 Personal history of nicotine dependence: Secondary | ICD-10-CM | POA: Insufficient documentation

## 2022-08-13 DIAGNOSIS — N132 Hydronephrosis with renal and ureteral calculous obstruction: Secondary | ICD-10-CM | POA: Insufficient documentation

## 2022-08-13 DIAGNOSIS — R109 Unspecified abdominal pain: Secondary | ICD-10-CM | POA: Diagnosis present

## 2022-08-13 HISTORY — DX: Malignant (primary) neoplasm, unspecified: C80.1

## 2022-08-13 HISTORY — DX: Malignant neoplasm of colon, unspecified: C18.9

## 2022-08-13 LAB — CBC WITH DIFFERENTIAL/PLATELET
Abs Immature Granulocytes: 0.06 10*3/uL (ref 0.00–0.07)
Basophils Absolute: 0 10*3/uL (ref 0.0–0.1)
Basophils Relative: 0 %
Eosinophils Absolute: 0 10*3/uL (ref 0.0–0.5)
Eosinophils Relative: 0 %
HCT: 34.9 % — ABNORMAL LOW (ref 36.0–46.0)
Hemoglobin: 11.7 g/dL — ABNORMAL LOW (ref 12.0–15.0)
Immature Granulocytes: 1 %
Lymphocytes Relative: 7 %
Lymphs Abs: 0.7 10*3/uL (ref 0.7–4.0)
MCH: 31.1 pg (ref 26.0–34.0)
MCHC: 33.5 g/dL (ref 30.0–36.0)
MCV: 92.8 fL (ref 80.0–100.0)
Monocytes Absolute: 0.6 10*3/uL (ref 0.1–1.0)
Monocytes Relative: 6 %
Neutro Abs: 9.2 10*3/uL — ABNORMAL HIGH (ref 1.7–7.7)
Neutrophils Relative %: 86 %
Platelets: 222 10*3/uL (ref 150–400)
RBC: 3.76 MIL/uL — ABNORMAL LOW (ref 3.87–5.11)
RDW: 12.7 % (ref 11.5–15.5)
WBC: 10.6 10*3/uL — ABNORMAL HIGH (ref 4.0–10.5)
nRBC: 0 % (ref 0.0–0.2)

## 2022-08-13 LAB — COMPREHENSIVE METABOLIC PANEL
ALT: 8 U/L (ref 0–44)
AST: 10 U/L — ABNORMAL LOW (ref 15–41)
Albumin: 3.3 g/dL — ABNORMAL LOW (ref 3.5–5.0)
Alkaline Phosphatase: 46 U/L (ref 38–126)
Anion gap: 7 (ref 5–15)
BUN: 18 mg/dL (ref 6–20)
CO2: 24 mmol/L (ref 22–32)
Calcium: 8.2 mg/dL — ABNORMAL LOW (ref 8.9–10.3)
Chloride: 103 mmol/L (ref 98–111)
Creatinine, Ser: 0.77 mg/dL (ref 0.44–1.00)
GFR, Estimated: >60 mL/min (ref >60)
Glucose, Bld: 119 mg/dL — ABNORMAL HIGH (ref 70–99)
Potassium: 3.9 mmol/L (ref 3.5–5.1)
Sodium: 134 mmol/L — ABNORMAL LOW (ref 135–145)
Total Bilirubin: 0.6 mg/dL (ref 0.3–1.2)
Total Protein: 5.8 g/dL — ABNORMAL LOW (ref 6.5–8.1)

## 2022-08-13 LAB — URINALYSIS, ROUTINE W REFLEX MICROSCOPIC
Bilirubin Urine: NEGATIVE
Glucose, UA: NEGATIVE mg/dL
Ketones, ur: 5 mg/dL — AB
Leukocytes,Ua: NEGATIVE
Nitrite: NEGATIVE
Protein, ur: NEGATIVE mg/dL
RBC / HPF: >50 RBC/hpf (ref 0–5)
Specific Gravity, Urine: 1.021 (ref 1.005–1.030)
pH: 5 (ref 5.0–8.0)

## 2022-08-13 MED ORDER — HYDROCODONE-ACETAMINOPHEN 5-325 MG PO TABS: 1.0000 | ORAL_TABLET | Freq: Four times a day (QID) | ORAL | 0 refills | Status: AC | PRN

## 2022-08-13 MED ORDER — ONDANSETRON HCL 4 MG PO TABS: 4.0000 mg | ORAL_TABLET | Freq: Three times a day (TID) | ORAL | 0 refills | Status: AC | PRN

## 2022-08-13 MED ORDER — TAMSULOSIN HCL 0.4 MG PO CAPS: 0.4000 mg | ORAL_CAPSULE | Freq: Every day | ORAL | 0 refills | Status: AC

## 2022-08-13 MED ORDER — ONDANSETRON 4 MG PO TBDP
4.0000 mg | ORAL_TABLET | Freq: Once | ORAL | Status: AC | PRN
  Administered 2022-08-13: 4 mg via ORAL
  Filled 2022-08-13: qty 1

## 2022-08-13 MED ORDER — IBUPROFEN 600 MG PO TABS: 600.0000 mg | ORAL_TABLET | Freq: Four times a day (QID) | ORAL | 0 refills | Status: AC | PRN

## 2022-08-13 MED ORDER — FENTANYL CITRATE PF 50 MCG/ML IJ SOSY
50.0000 ug | PREFILLED_SYRINGE | INTRAMUSCULAR | Status: DC | PRN
  Administered 2022-08-13: 50 ug via NASAL
  Filled 2022-08-13: qty 1

## 2022-08-13 MED ORDER — SODIUM CHLORIDE 0.9 % IV BOLUS
1000.0000 mL | Freq: Once | INTRAVENOUS | Status: AC
  Administered 2022-08-13: 1000 mL via INTRAVENOUS

## 2022-08-13 MED ORDER — KETOROLAC TROMETHAMINE 15 MG/ML IJ SOLN
15.0000 mg | Freq: Once | INTRAMUSCULAR | Status: AC
  Administered 2022-08-13: 15 mg via INTRAVENOUS
  Filled 2022-08-13: qty 1

## 2022-08-13 MED ORDER — ONDANSETRON HCL 4 MG/2ML IJ SOLN
4.0000 mg | Freq: Once | INTRAMUSCULAR | Status: AC
  Administered 2022-08-13: 4 mg via INTRAVENOUS
  Filled 2022-08-13: qty 2

## 2022-08-13 NOTE — ED Provider Notes (Signed)
[place] EMERGENCY DEPARTMENT AT [facility] Provider Note  CSN: [id number] Arrival date & time: 08/13/22 2035  Chief Complaint(s) Back Pain and Dysuria  HPI Amy Hardy is a 52 y.o. female with PMH colon cancer s/p resection presenting with left flank pain. Also reports urinary frequency/urge. No dysuria. No fevers or chills, began yesterday. Pain severe. Reports nausea, no vomiting. Denies similar symptoms. Pain is constant   Past Medical History Past Medical History:  Diagnosis Date   Cancer    Colon cancer    There are no problems to display for this patient.  Home Medication(s) Prior to Admission medications   Medication Sig Start Date End Date Taking? Authorizing Provider  HYDROcodone-acetaminophen (NORCO/VICODIN) 5-325 MG tablet Take 1 tablet by mouth every 6 (six) hours as needed for severe pain. 08/13/22  Yes Lonell Grandchild, MD  ibuprofen (ADVIL) 600 MG tablet Take 1 tablet (600 mg total) by mouth every 6 (six) hours as needed. 08/13/22  Yes Lonell Grandchild, MD  ondansetron (ZOFRAN) 4 MG tablet Take 1 tablet (4 mg total) by mouth every 8 (eight) hours as needed for nausea or vomiting. 08/13/22  Yes Lonell Grandchild, MD  tamsulosin (FLOMAX) 0.4 MG CAPS capsule Take 1 capsule (0.4 mg total) by mouth daily. 08/13/22 09/12/22 Yes Lonell Grandchild, MD  folic acid (FOLVITE) 1 MG tablet Take by mouth. 11/28/21   [provider]  ondansetron (ZOFRAN-ODT) 4 MG disintegrating tablet Take 1 tablet (4 mg total) by mouth every 8 (eight) hours as needed for nausea or vomiting. 01/19/22   Mardella Layman, MD  predniSONE (STERAPRED UNI-PAK 48 TAB) 10 MG (48) TBPK tablet Take as directed. 01/19/22   Mardella Layman, MD  VITAMIN D PO Take by mouth.    [provider]                                                                                                                                    Past Surgical History Past Surgical History:  Procedure Laterality Date    CHOLECYSTECTOMY     COLON RESECTION     TONSILLECTOMY     TUBAL LIGATION     VARICOSE VEIN SURGERY     Family History History reviewed. No pertinent family history.  Social History Social History   Tobacco Use   Smoking status: Former    Types: Cigarettes   Smokeless tobacco: Never   Tobacco comments:    1-2 cigarettes "once in a blue moon" as of 03/18/21       Vaping Use   Vaping Use: Never used  Substance Use Topics   Alcohol use: Not Currently    Comment: occasionally   Drug use: No   Allergies Patient has no known allergies.  Review of Systems Review of Systems  All other systems reviewed and are negative.   Physical Exam Vital Signs  I have reviewed the triage vital signs  BP 115/77   Pulse 95   Temp 97.9 F (36.6 C) (Oral)   Resp 15   Ht 5\' 8"  (1.727 m)   Wt 61.2 kg   LMP  (LMP Unknown)   SpO2 97%   BMI 20.53 kg/m  Physical Exam Vitals and nursing note reviewed.  Constitutional:      General: She is not in acute distress.    Appearance: She is well-developed.  HENT:     Head: Normocephalic and atraumatic.     Mouth/Throat:     Mouth: Mucous membranes are moist.  Eyes:     Pupils: Pupils are equal, round, and reactive to light.  Cardiovascular:     Rate and Rhythm: Normal rate and regular rhythm.     Heart sounds: No murmur heard. Pulmonary:     Effort: Pulmonary effort is normal. No respiratory distress.     Breath sounds: Normal breath sounds.  Abdominal:     General: Abdomen is flat.     Palpations: Abdomen is soft.     Tenderness: There is no abdominal tenderness. There is no right CVA tenderness or left CVA tenderness.  Musculoskeletal:        General: No tenderness.     Right lower leg: No edema.     Left lower leg: No edema.  Skin:    General: Skin is warm and dry.  Neurological:     General: No focal deficit present.     Mental Status: She is alert. Mental status is at baseline.  Psychiatric:        Mood and Affect: Mood  normal.        Behavior: Behavior normal.     ED Results and Treatments Labs (all labs ordered are listed, but only abnormal results are displayed) Labs Reviewed  URINALYSIS, ROUTINE W REFLEX MICROSCOPIC - Abnormal; Notable for the following components:      Result Value   Hgb urine dipstick MODERATE (*)    Ketones, ur 5 (*)    Bacteria, UA RARE (*)    All other components within normal limits  COMPREHENSIVE METABOLIC PANEL - Abnormal; Notable for the following components:   Sodium 134 (*)    Glucose, Bld 119 (*)    Calcium 8.2 (*)    Total Protein 5.8 (*)    Albumin 3.3 (*)    AST 10 (*)    All other components within normal limits  CBC WITH DIFFERENTIAL/PLATELET - Abnormal; Notable for the following components:   WBC 10.6 (*)    RBC 3.76 (*)    Hemoglobin 11.7 (*)    HCT 34.9 (*)    Neutro Abs 9.2 (*)    All other components within normal limits                                                                                                                          Radiology CT Renal Stone Study  Result Date: 08/13/2022 CLINICAL DATA:  Inability  to urinate since yesterday. Associated back pain. EXAM: CT ABDOMEN AND PELVIS WITHOUT CONTRAST TECHNIQUE: Multidetector CT imaging of the abdomen and pelvis was performed following the standard protocol without IV contrast. RADIATION DOSE REDUCTION: This exam was performed according to the departmental dose-optimization program which includes automated exposure control, adjustment of the mA and/or kV according to patient size and/or use of iterative reconstruction technique. COMPARISON:  CT abdomen and pelvis 05/24/2022 FINDINGS: Lower chest: No acute abnormality. Hepatobiliary: No focal liver abnormality is seen. Status post cholecystectomy. No biliary dilatation. Pancreas: Unremarkable. Spleen: Unremarkable. Adrenals/Urinary Tract: Unremarkable adrenal glands. 5 mm stone or stones at the left UVJ with moderate left hydroureteronephrosis.  No obstructing right urinary calculi. Unremarkable bladder. Stomach/Bowel: Normal caliber large and small bowel. Postoperative changes about the colon with anastomosis in the posterior pelvis. Moderate colonic stool load. Normal appendix. Unremarkable stomach. Vascular/Lymphatic: Aortic atherosclerosis. No enlarged abdominal or pelvic lymph nodes. Reproductive: Tubal ligation clip.  No acute abnormality. Other: No free intraperitoneal fluid or air. Musculoskeletal: No acute osseous abnormality. IMPRESSION: 1. 5 mm stone or stones at the left UVJ with moderate left hydroureteronephrosis. Aortic Atherosclerosis (ICD10-I70.0). Electronically Signed   By: Minerva Fester M.D.   On: 08/13/2022 23:28    Pertinent labs & imaging results that were available during my care of the patient were reviewed by me and considered in my medical decision making (see MDM for details).  Medications Ordered in ED Medications  ondansetron (ZOFRAN-ODT) disintegrating tablet 4 mg (4 mg Oral Given 08/13/22 2133)  sodium chloride 0.9 % bolus 1,000 mL (0 mLs Intravenous Stopped 08/13/22 2352)  ondansetron (ZOFRAN) injection 4 mg (4 mg Intravenous Given 08/13/22 2238)  ketorolac (TORADOL) 15 MG/ML injection 15 mg (15 mg Intravenous Given 08/13/22 2238)                                                                                                                                     Procedures Procedures  (including critical care time)  Medical Decision Making / ED Course   MDM:  52 y/o with flank pain/urinary frequency.   Exam w/o CVAT or abdominal TTP.  History highly concerning for nephrolithiasis. CT confirms nephrolithiasis with 5mm stone. UA with trace bacteria but otherwise not concerning for UTI, patient denies dysuria and has no CVAT to suggest pyelonephritis, no fevers or chills. Low concern for infection overall but discussed strict return precautions for dysuria, fevers or chills, other infectious symptoms. Symptoms  resolved s/p toradol. CT otherwise without acute process. Advised urology follow up, provided contact for appointment. Will prescribe ibuprofen and norco. [place] Controlled Substance Reporting System database was reviewed. and patient was instructed, not to drive, operate heavy machinery, perform activities at heights, swimming or participation in water activities or provide baby-sitting services while on Pain, Sleep and Anxiety Medications; until their outpatient Physician has advised to do so again. Also recommended to not to take more than  prescribed Pain, Sleep and Anxiety Medications. Also will prescribe flomax. Will discharge patient to home. All questions answered. Patient comfortable with plan of discharge. Return precautions discussed with patient and specified on the after visit summary.       Additional history obtained: -Additional history obtained from spouse -External records from outside source obtained and reviewed including: Chart review including previous notes, labs, imaging, consultation notes including recent surgery for colon recsection   Lab Tests: -I ordered, reviewed, and interpreted labs.   The pertinent results include:   Labs Reviewed  URINALYSIS, ROUTINE W REFLEX MICROSCOPIC - Abnormal; Notable for the following components:      Result Value   Hgb urine dipstick MODERATE (*)    Ketones, ur 5 (*)    Bacteria, UA RARE (*)    All other components within normal limits  COMPREHENSIVE METABOLIC PANEL - Abnormal; Notable for the following components:   Sodium 134 (*)    Glucose, Bld 119 (*)    Calcium 8.2 (*)    Total Protein 5.8 (*)    Albumin 3.3 (*)    AST 10 (*)    All other components within normal limits  CBC WITH DIFFERENTIAL/PLATELET - Abnormal; Notable for the following components:   WBC 10.6 (*)    RBC 3.76 (*)    Hemoglobin 11.7 (*)    HCT 34.9 (*)    Neutro Abs 9.2 (*)    All other components within normal limits    Notable for borderline  leukocytosis, trace bacturia without other signs of UTI  Imaging Studies ordered: I ordered imaging studies including CT Abd On my interpretation imaging demonstrates nephrolithiasis  I independently visualized and interpreted imaging. I agree with the radiologist interpretation   Medicines ordered and prescription drug management: Meds ordered this encounter  Medications   DISCONTD: fentaNYL (SUBLIMAZE) injection 50 mcg   ondansetron (ZOFRAN-ODT) disintegrating tablet 4 mg   sodium chloride 0.9 % bolus 1,000 mL   ondansetron (ZOFRAN) injection 4 mg   ketorolac (TORADOL) 15 MG/ML injection 15 mg   tamsulosin (FLOMAX) 0.4 MG CAPS capsule    Sig: Take 1 capsule (0.4 mg total) by mouth daily.    Dispense:  30 capsule    Refill:  0   ibuprofen (ADVIL) 600 MG tablet    Sig: Take 1 tablet (600 mg total) by mouth every 6 (six) hours as needed.    Dispense:  30 tablet    Refill:  0   ondansetron (ZOFRAN) 4 MG tablet    Sig: Take 1 tablet (4 mg total) by mouth every 8 (eight) hours as needed for nausea or vomiting.    Dispense:  12 tablet    Refill:  0   HYDROcodone-acetaminophen (NORCO/VICODIN) 5-325 MG tablet    Sig: Take 1 tablet by mouth every 6 (six) hours as needed for severe pain.    Dispense:  10 tablet    Refill:  0    -I have reviewed the patients home medicines and have made adjustments as needed    Cardiac Monitoring: The patient was maintained on a cardiac monitor.  I personally viewed and interpreted the cardiac monitored which showed an underlying rhythm of: NSR  Social Determinants of Health:  Diagnosis or treatment significantly limited by social determinants of health: former smoker   Reevaluation: After the interventions noted above, I reevaluated the patient and found that their symptoms have resolved  Co morbidities that complicate the patient evaluation  Past Medical History:  Diagnosis Date  Cancer    Colon cancer       Dispostion: Disposition  decision including need for hospitalization was considered, and patient discharged from emergency department.    Final Clinical Impression(s) / ED Diagnoses Final diagnoses:  Nephrolithiasis     This chart was dictated using voice recognition software.  Despite best efforts to proofread,  errors can occur which can change the documentation meaning.    Lonell Grandchild, MD 08/14/22 1250

## 2022-08-13 NOTE — Discharge Instructions (Addendum)
We evaluated you for your back pain and urinary symptoms.  Your CT scan showed a kidney stone.  Your kidney stone is 5 mm.  Usually, these kidney stones pass on their own, however we would like you to follow-up with the urologist on-call, Dr. Tresa Moore.  Please call tomorrow to schedule appointment for follow-up.  Please take 600 mg of ibuprofen every 6 hours as needed for your pain.  If your pain is uncontrolled, you can also take 1 Norco every 6 hours as needed for pain.  Do not mix Norco with alcohol or drive while taking this medication as it may make you sleepy.  If you develop nausea, I prescribed you a medication called Zofran which you can take every 8 hours as needed for nausea.  I have also prescribed a medication called Flomax which should help the kidney stone pass.  Please return to the emergency department if you develop any new or worsening symptoms such as fevers or chills, painful urination, lightheadedness or dizziness, uncontrolled symptoms, uncontrolled vomiting, or any other concerning symptoms

## 2022-08-13 NOTE — ED Triage Notes (Signed)
Pt presents with the inability to urinate since yesterday. Pt has associated back pain.Pt states only small dribbles come out.

## 2022-09-25 ENCOUNTER — Ambulatory Visit: Payer: BC Managed Care – PPO | Admitting: Urology

## 2023-02-16 ENCOUNTER — Ambulatory Visit
Admission: EM | Admit: 2023-02-16 | Discharge: 2023-02-16 | Disposition: A | Discharge status: Home / Self Care | Payer: BC Managed Care – PPO | Attending: Family Medicine | Admitting: Family Medicine

## 2023-02-16 DIAGNOSIS — Z1152 Encounter for screening for COVID-19: Secondary | ICD-10-CM | POA: Insufficient documentation

## 2023-02-16 DIAGNOSIS — J069 Acute upper respiratory infection, unspecified: Secondary | ICD-10-CM | POA: Diagnosis not present

## 2023-02-16 MED ORDER — ALBUTEROL SULFATE HFA 108 (90 BASE) MCG/ACT IN AERS: 2.0000 | INHALATION_SPRAY | RESPIRATORY_TRACT | 0 refills | Status: AC | PRN

## 2023-02-16 MED ORDER — PROMETHAZINE-DM 6.25-15 MG/5ML PO SYRP: 5.0000 mL | ORAL_SOLUTION | Freq: Four times a day (QID) | ORAL | 0 refills | Status: AC | PRN

## 2023-02-16 NOTE — ED Triage Notes (Signed)
Pt reports she has had chest discomfort from coughing and congestion, cough, and left ear fullness x 2 days.

## 2023-02-16 NOTE — ED Provider Notes (Signed)
[facility]    CSN: [id number] Arrival date & time: 02/16/23  1031      History   Chief Complaint No chief complaint on file.   HPI Amy Hardy is a 52 y.o. female.   Patient presenting today with 2-day history of chest tightness, congestion, cough, sore throat, left ear fullness.  Denies fever, chills, chest pain, shortness of breath, abdominal pain, nausea vomiting or diarrhea.  So far taking over-the-counter cold ingestion medications with minimal relief.    Past Medical History:  Diagnosis Date   Cancer [facility])    Colon cancer (HCC)     There are no problems to display for this patient.   Past Surgical History:  Procedure Laterality Date   CHOLECYSTECTOMY     COLON RESECTION     TONSILLECTOMY     TUBAL LIGATION     VARICOSE VEIN SURGERY      OB History   No obstetric history on file.      Home Medications    Prior to Admission medications   Medication Sig Start Date End Date Taking? Authorizing Provider  albuterol (VENTOLIN HFA) 108 (90 Base) MCG/ACT inhaler Inhale 2 puffs into the lungs every 4 (four) hours as needed for wheezing or shortness of breath. 02/16/23  Yes Particia Nearing, PA-C  clonazePAM (KLONOPIN) 0.5 MG tablet Take 0.5 mg by mouth daily. 01/17/23  Yes [provider]  promethazine-dextromethorphan (PROMETHAZINE-DM) 6.25-15 MG/5ML syrup Take 5 mLs by mouth 4 (four) times daily as needed. 02/16/23  Yes Particia Nearing, PA-C  folic acid (FOLVITE) 1 MG tablet Take by mouth. 11/28/21   [provider]  HYDROcodone-acetaminophen (NORCO/VICODIN) 5-325 MG tablet Take 1 tablet by mouth every 6 (six) hours as needed for severe pain. 08/13/22   Lonell Grandchild, MD  ibuprofen (ADVIL) 600 MG tablet Take 1 tablet (600 mg total) by mouth every 6 (six) hours as needed. 08/13/22   Lonell Grandchild, MD  ondansetron (ZOFRAN) 4 MG tablet Take 1 tablet (4 mg total) by mouth every 8 (eight) hours as needed for nausea or  vomiting. 08/13/22   Lonell Grandchild, MD  ondansetron (ZOFRAN-ODT) 4 MG disintegrating tablet Take 1 tablet (4 mg total) by mouth every 8 (eight) hours as needed for nausea or vomiting. 01/19/22   Mardella Layman, MD  predniSONE (STERAPRED UNI-PAK 48 TAB) 10 MG (48) TBPK tablet Take as directed. 01/19/22   Mardella Layman, MD  VITAMIN D PO Take by mouth.    [provider]    Family History History reviewed. No pertinent family history.  Social History Social History   Tobacco Use   Smoking status: Former    Types: Cigarettes   Smokeless tobacco: Never   Tobacco comments:    1-2 cigarettes "once in a blue moon" as of 03/18/21       Vaping Use   Vaping status: Never Used  Substance Use Topics   Alcohol use: Not Currently    Comment: occasionally   Drug use: No     Allergies   Patient has no known allergies.   Review of Systems Review of Systems Per HPI  Physical Exam Triage Vital Signs ED Triage Vitals  Encounter Vitals Group     BP 02/16/23 1036 133/86     Systolic BP Percentile --      Diastolic BP Percentile --      Pulse Rate 02/16/23 1036 82     Resp 02/16/23 1036 20  Temp 02/16/23 1036 98.2 F (36.8 C)     Temp Source 02/16/23 1036 Oral     SpO2 02/16/23 1036 100 %     Weight --      Height --      Head Circumference --      Peak Flow --      Pain Score 02/16/23 1037 0     Pain Loc --      Pain Education --      Exclude from Growth Chart --    No data found.  Updated Vital Signs BP 133/86 (BP Location: Right Arm)   Pulse 82   Temp 98.2 F (36.8 C) (Oral)   Resp 20   LMP  (LMP Unknown)   SpO2 100%   Visual Acuity Right Eye Distance:   Left Eye Distance:   Bilateral Distance:    Right Eye Near:   Left Eye Near:    Bilateral Near:     Physical Exam Vitals and nursing note reviewed.  Constitutional:      Appearance: Normal appearance. She is not ill-appearing.  HENT:     Head: Atraumatic.     Right Ear: Tympanic membrane and  external ear normal.     Left Ear: Tympanic membrane and external ear normal.     Nose: Rhinorrhea present.     Mouth/Throat:     Mouth: Mucous membranes are moist.     Pharynx: Posterior oropharyngeal erythema present.  Eyes:     Extraocular Movements: Extraocular movements intact.     Conjunctiva/sclera: Conjunctivae normal.  Cardiovascular:     Rate and Rhythm: Normal rate and regular rhythm.     Heart sounds: Normal heart sounds.  Pulmonary:     Effort: Pulmonary effort is normal.     Breath sounds: Normal breath sounds. No wheezing.  Musculoskeletal:        General: Normal range of motion.     Cervical back: Normal range of motion and neck supple.  Skin:    General: Skin is warm and dry.  Neurological:     Mental Status: She is alert and oriented to person, place, and time.     Motor: No weakness.     Gait: Gait normal.  Psychiatric:        Mood and Affect: Mood normal.        Thought Content: Thought content normal.        Judgment: Judgment normal.      UC Treatments / Results  Labs (all labs ordered are listed, but only abnormal results are displayed) Labs Reviewed  SARS CORONAVIRUS 2 (TAT 6-24 HRS)    EKG   Radiology No results found.  Procedures Procedures (including critical care time)  Medications Ordered in UC Medications - No data to display  Initial Impression / Assessment and Plan / UC Course  I have reviewed the triage vital signs and the nursing notes.  Pertinent labs & imaging results that were available during my care of the patient were reviewed by me and considered in my medical decision making (see chart for details).     Vital signs and exam overall reassuring, suspicious for viral respiratory infection.  COVID testing pending, good candidate for Paxlovid if positive.  Given her chest tightness, will send albuterol inhaler and Phenergan DM for cough.  Discussed supportive over-the-counter medications, home care additionally.  Return  for worsening symptoms.  Work note given  Final Clinical Impressions(s) / UC Diagnoses   Final diagnoses:  Viral URI with cough   Discharge Instructions   None    ED Prescriptions     Medication Sig Dispense Auth. Provider   albuterol (VENTOLIN HFA) 108 (90 Base) MCG/ACT inhaler Inhale 2 puffs into the lungs every 4 (four) hours as needed for wheezing or shortness of breath. 18 g Roosvelt Maser [place], [place]   promethazine-dextromethorphan (PROMETHAZINE-DM) 6.25-15 MG/5ML syrup Take 5 mLs by mouth 4 (four) times daily as needed. 100 mL Particia Nearing, [place]      PDMP not reviewed this encounter.   Particia Nearing, [place] 02/16/23 1449

## 2023-02-17 LAB — SARS CORONAVIRUS 2 (TAT 6-24 HRS): SARS Coronavirus 2: NEGATIVE

## 2024-01-28 ENCOUNTER — Ambulatory Visit (INDEPENDENT_AMBULATORY_CARE_PROVIDER_SITE_OTHER): Admitting: Otolaryngology

## 2024-01-28 ENCOUNTER — Encounter (INDEPENDENT_AMBULATORY_CARE_PROVIDER_SITE_OTHER): Payer: Self-pay | Admitting: Otolaryngology

## 2024-01-28 VITALS — BP 133/84 | HR 76 | Temp 98.0°F

## 2024-01-28 DIAGNOSIS — H6980 Other specified disorders of Eustachian tube, unspecified ear: Secondary | ICD-10-CM

## 2024-01-28 DIAGNOSIS — R42 Dizziness and giddiness: Secondary | ICD-10-CM | POA: Diagnosis not present

## 2024-01-28 DIAGNOSIS — H6983 Other specified disorders of Eustachian tube, bilateral: Secondary | ICD-10-CM

## 2024-01-28 DIAGNOSIS — J310 Chronic rhinitis: Secondary | ICD-10-CM | POA: Diagnosis not present

## 2024-01-28 NOTE — Progress Notes (Signed)
 Patient ID: Amy Hardy, female   DOB: May 10, 1971, 53 y.o.   MRN: [id number]  Follow-up: Recurrent dizziness, left ear pain, left ear pressure  HPI: The patient is a 53 year old female who returns today for her follow-up evaluation.  The patient has a history of recurrent dizziness. The patient has been symptomatic since October 2022. Her symptoms started after her COVID infection.  She described her dizziness as a spinning vertigo that was often accompanied by nystagmus, nausea, and vomiting. The patient recently underwent vestibular neurodiagnostic testing.  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  Her rotary chair testing showed abnormal phase lead in the high frequencies, consistent with central involvement.  She was treated with vestibular therapy.  The patient returns today reporting improvement in her dizziness.  She still has occasional off-balance sensation.  However, she has not had any spinning vertigo.  The patient returns today complaining of frequent popping sensation in her ears.  She denies any significant otalgia, otorrhea, or recent change in her hearing.  She was diagnosed with several ear infections over the past 2 years.  Exam: General: Communicates without difficulty, well nourished, no acute distress. Head: Normocephalic, no evidence injury, no tenderness, facial buttresses intact without stepoff. Face/sinus: No tenderness to palpation and percussion. Facial movement is normal and symmetric. Eyes: PERRL, EOMI. No scleral icterus, conjunctivae clear. Neuro: CN II exam reveals vision grossly intact.  No nystagmus at any point of gaze. Ears: Auricles well formed without lesions.  Ear canals are intact without mass or lesion.  No erythema or edema is appreciated.  The TMs are intact without fluid. Nose: External evaluation reveals normal support and skin without lesions.  Dorsum is intact.  Anterior rhinoscopy reveals congested mucosa over anterior aspect of inferior turbinates  and intact septum.  No purulence noted. Oral:  Oral cavity and oropharynx are intact, symmetric, without erythema or edema.  Mucosa is moist without lesions. Neck: Full range of motion without pain.  There is no significant lymphadenopathy.  No masses palpable.  Thyroid bed within normal limits to palpation.  Parotid glands and submandibular glands equal bilaterally without mass.  Trachea is midline. Neuro:  CN 2-12 grossly intact. Gait wide-based. Vestibular: No nystagmus at any point of gaze. Dix Hallpike negative. Vestibular: There is no nystagmus with pneumatic pressure on either tympanic membrane or Valsalva. The cerebellar examination is unremarkable.   Assessment 1.  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  The severity of her dizziness has improved after her vestibular therapy. 2.  Her symptoms are consistent with eustachian tube dysfunction. 3.  The patient's ear canals, tympanic membranes, and middle ear spaces are normal. Her Dix-Hallpike maneuver is negative.  4.  Chronic rhinitis.  Plan  1.  The physical exam findings are reviewed with the patient.  2.  The pathophysiology of eustachian tube dysfunction, central vestibular dysfunction, and dizziness are extensively discussed with the patient. Questions are invited and answered.  3.  Flonase nasal spray 2 sprays each nostril daily. 4.  Continue with vestibular exercises at home. 5.  The patient will return for reevaluation in 6 months, sooner if needed.

## 2024-02-01 DIAGNOSIS — R42 Dizziness and giddiness: Secondary | ICD-10-CM | POA: Insufficient documentation

## 2024-02-01 DIAGNOSIS — H6983 Other specified disorders of Eustachian tube, bilateral: Secondary | ICD-10-CM | POA: Insufficient documentation

## 2024-02-01 DIAGNOSIS — J310 Chronic rhinitis: Secondary | ICD-10-CM | POA: Insufficient documentation

## 2024-07-28 ENCOUNTER — Ambulatory Visit (INDEPENDENT_AMBULATORY_CARE_PROVIDER_SITE_OTHER): Admitting: Otolaryngology
